# Patient Record
Sex: Female | Born: 1985
Health system: Southern US, Community
[De-identification: ages and names within clinical notes are randomized; demographics above are authoritative.]

## PROBLEM LIST (undated history)

## (undated) ENCOUNTER — Inpatient Hospital Stay (HOSPITAL_COMMUNITY): Payer: Self-pay

## (undated) DIAGNOSIS — J45909 Unspecified asthma, uncomplicated: Secondary | ICD-10-CM

## (undated) DIAGNOSIS — D649 Anemia, unspecified: Secondary | ICD-10-CM

## (undated) DIAGNOSIS — K429 Umbilical hernia without obstruction or gangrene: Secondary | ICD-10-CM

## (undated) DIAGNOSIS — O039 Complete or unspecified spontaneous abortion without complication: Secondary | ICD-10-CM

## (undated) DIAGNOSIS — I82409 Acute embolism and thrombosis of unspecified deep veins of unspecified lower extremity: Secondary | ICD-10-CM

## (undated) DIAGNOSIS — O021 Missed abortion: Secondary | ICD-10-CM

## (undated) DIAGNOSIS — D6862 Lupus anticoagulant syndrome: Secondary | ICD-10-CM

## (undated) HISTORY — DX: Complete or unspecified spontaneous abortion without complication: O03.9

## (undated) HISTORY — DX: Anemia, unspecified: D64.9

## (undated) HISTORY — PX: TONSILLECTOMY: SUR1361

## (undated) HISTORY — DX: Lupus anticoagulant syndrome: D68.62

## (undated) HISTORY — DX: Missed abortion: O02.1

## (undated) SURGERY — Surgical Case
Anesthesia: *Unknown

---
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
## 2004-07-06 ENCOUNTER — Emergency Department (HOSPITAL_COMMUNITY): Admission: EM | Admit: 2004-07-06 | Discharge: 2004-07-06 | Payer: Self-pay | Admitting: Emergency Medicine

## 2005-02-01 ENCOUNTER — Emergency Department (HOSPITAL_COMMUNITY): Admission: EM | Admit: 2005-02-01 | Discharge: 2005-02-02 | Payer: Self-pay | Admitting: Emergency Medicine

## 2005-05-01 DIAGNOSIS — D649 Anemia, unspecified: Secondary | ICD-10-CM

## 2005-05-01 HISTORY — DX: Anemia, unspecified: D64.9

## 2005-08-29 ENCOUNTER — Ambulatory Visit (HOSPITAL_COMMUNITY): Admission: RE | Admit: 2005-08-29 | Discharge: 2005-08-29 | Payer: Self-pay | Admitting: Obstetrics

## 2005-09-29 ENCOUNTER — Ambulatory Visit (HOSPITAL_COMMUNITY): Admission: RE | Admit: 2005-09-29 | Discharge: 2005-09-29 | Payer: Self-pay | Admitting: Obstetrics

## 2005-11-09 ENCOUNTER — Inpatient Hospital Stay (HOSPITAL_COMMUNITY): Admission: AD | Admit: 2005-11-09 | Discharge: 2005-11-10 | Payer: Self-pay | Admitting: Obstetrics

## 2005-11-19 ENCOUNTER — Inpatient Hospital Stay (HOSPITAL_COMMUNITY): Admission: AD | Admit: 2005-11-19 | Discharge: 2005-11-19 | Payer: Self-pay | Admitting: Obstetrics & Gynecology

## 2006-02-10 ENCOUNTER — Inpatient Hospital Stay (HOSPITAL_COMMUNITY): Admission: AD | Admit: 2006-02-10 | Discharge: 2006-02-10 | Payer: Self-pay | Admitting: Obstetrics

## 2006-02-26 ENCOUNTER — Observation Stay (HOSPITAL_COMMUNITY): Admission: AD | Admit: 2006-02-26 | Discharge: 2006-02-26 | Payer: Self-pay | Admitting: Obstetrics & Gynecology

## 2006-02-28 ENCOUNTER — Inpatient Hospital Stay (HOSPITAL_COMMUNITY): Admission: AD | Admit: 2006-02-28 | Discharge: 2006-03-04 | Payer: Self-pay | Admitting: Obstetrics & Gynecology

## 2006-03-01 DIAGNOSIS — D6862 Lupus anticoagulant syndrome: Secondary | ICD-10-CM

## 2006-03-01 HISTORY — DX: Lupus anticoagulant syndrome: D68.62

## 2006-03-21 ENCOUNTER — Encounter: Payer: Self-pay | Admitting: Emergency Medicine

## 2006-03-21 ENCOUNTER — Inpatient Hospital Stay (HOSPITAL_COMMUNITY): Admission: AD | Admit: 2006-03-21 | Discharge: 2006-03-30 | Payer: Self-pay | Admitting: Obstetrics

## 2006-03-24 ENCOUNTER — Encounter: Payer: Self-pay | Admitting: Vascular Surgery

## 2006-04-03 ENCOUNTER — Encounter (INDEPENDENT_AMBULATORY_CARE_PROVIDER_SITE_OTHER): Payer: Self-pay | Admitting: Infectious Diseases

## 2006-04-03 ENCOUNTER — Ambulatory Visit: Payer: Self-pay | Admitting: *Deleted

## 2006-04-03 LAB — CONVERTED CEMR LAB
ALT: 33 units/L (ref 0–35)
AST: 32 units/L (ref 0–37)
Alkaline Phosphatase: 67 units/L (ref 39–117)
Basophils Absolute: 0.2 10*3/uL — ABNORMAL HIGH (ref 0.0–0.1)
CO2: 31 meq/L (ref 19–32)
Eosinophils Relative: 2 % (ref 0–4)
Glucose, Bld: 97 mg/dL (ref 70–99)
HCT: 24.8 % — ABNORMAL LOW (ref 34.4–43.3)
Lymphocytes Relative: 17 % (ref 15–43)
Lymphs Abs: 2 10*3/uL (ref 0.8–3.1)
MCV: 82.6 fL (ref 78.8–100.0)
Monocytes Relative: 9 % (ref 3–11)
Neutrophils Relative %: 70 % (ref 47–77)
Prothrombin Time: 25.3 s — ABNORMAL HIGH (ref 11.6–15.2)
RBC: 3 M/uL — ABNORMAL LOW (ref 3.79–4.96)
Total Bilirubin: 0.3 mg/dL (ref 0.3–1.2)
Total Protein: 7.9 g/dL (ref 6.0–8.3)
WBC: 11.4 10*3/uL — ABNORMAL HIGH (ref 3.7–10.0)

## 2006-04-05 ENCOUNTER — Ambulatory Visit: Payer: Self-pay | Admitting: Internal Medicine

## 2006-04-05 ENCOUNTER — Encounter (INDEPENDENT_AMBULATORY_CARE_PROVIDER_SITE_OTHER): Payer: Self-pay | Admitting: Infectious Diseases

## 2006-04-05 LAB — CONVERTED CEMR LAB
Basophils Relative: 0 % (ref 0–1)
Iron: 30 ug/dL — ABNORMAL LOW (ref 42–145)
Lymphocytes Relative: 23 % (ref 15–43)
MCHC: 32.3 g/dL — ABNORMAL LOW (ref 33.1–35.4)
Monocytes Relative: 11 % (ref 3–11)
Neutro Abs: 6 10*3/uL (ref 1.8–6.8)
Neutrophils Relative %: 63 % (ref 47–77)
Platelets: 1637 10*3/uL (ref 152–374)
RBC Folate: 895 ng/mL — ABNORMAL HIGH (ref 180–600)
RDW: 17.8 % — ABNORMAL HIGH (ref 11.5–15.3)
Saturation Ratios: 10 % — ABNORMAL LOW (ref 20–55)
TIBC: 297 ug/dL (ref 250–470)

## 2006-04-09 ENCOUNTER — Ambulatory Visit: Payer: Self-pay | Admitting: Internal Medicine

## 2006-04-14 ENCOUNTER — Ambulatory Visit: Payer: Self-pay | Admitting: Hematology & Oncology

## 2006-04-16 ENCOUNTER — Ambulatory Visit: Payer: Self-pay | Admitting: *Deleted

## 2006-04-23 ENCOUNTER — Ambulatory Visit: Payer: Self-pay | Admitting: Internal Medicine

## 2006-04-26 ENCOUNTER — Ambulatory Visit: Payer: Self-pay | Admitting: Internal Medicine

## 2006-04-26 ENCOUNTER — Encounter (INDEPENDENT_AMBULATORY_CARE_PROVIDER_SITE_OTHER): Payer: Self-pay | Admitting: Infectious Diseases

## 2006-04-26 LAB — CONVERTED CEMR LAB
Basophils Absolute: 0 10*3/uL (ref 0.0–0.1)
Hemoglobin: 10.6 g/dL — ABNORMAL LOW (ref 12.0–15.0)
Lymphocytes Relative: 60 % — ABNORMAL HIGH (ref 12–46)
Lymphs Abs: 2.4 10*3/uL (ref 0.7–3.3)
MCHC: 31.3 g/dL (ref 30.0–36.0)
Monocytes Absolute: 0.5 10*3/uL (ref 0.2–0.7)
Neutro Abs: 0.9 10*3/uL — ABNORMAL LOW (ref 1.7–7.7)
Neutrophils Relative %: 24 % — ABNORMAL LOW (ref 43–77)
RBC: 3.95 M/uL (ref 3.87–5.11)

## 2006-05-07 ENCOUNTER — Ambulatory Visit: Payer: Self-pay | Admitting: Hospitalist

## 2006-05-28 ENCOUNTER — Ambulatory Visit: Payer: Self-pay | Admitting: Hospitalist

## 2006-06-11 ENCOUNTER — Ambulatory Visit: Payer: Self-pay | Admitting: Hospitalist

## 2006-07-02 ENCOUNTER — Ambulatory Visit: Payer: Self-pay | Admitting: Internal Medicine

## 2007-04-26 ENCOUNTER — Emergency Department (HOSPITAL_COMMUNITY): Admission: EM | Admit: 2007-04-26 | Discharge: 2007-04-26 | Payer: Self-pay | Admitting: Family Medicine

## 2007-05-02 DIAGNOSIS — O039 Complete or unspecified spontaneous abortion without complication: Secondary | ICD-10-CM

## 2007-05-02 HISTORY — DX: Complete or unspecified spontaneous abortion without complication: O03.9

## 2007-11-13 ENCOUNTER — Observation Stay (HOSPITAL_COMMUNITY): Admission: AD | Admit: 2007-11-13 | Discharge: 2007-11-13 | Payer: Self-pay | Admitting: Obstetrics and Gynecology

## 2007-11-13 ENCOUNTER — Encounter (INDEPENDENT_AMBULATORY_CARE_PROVIDER_SITE_OTHER): Payer: Self-pay | Admitting: Obstetrics and Gynecology

## 2007-11-13 ENCOUNTER — Ambulatory Visit: Payer: Self-pay | Admitting: Gynecology

## 2007-11-18 ENCOUNTER — Observation Stay (HOSPITAL_COMMUNITY): Admission: AD | Admit: 2007-11-18 | Discharge: 2007-11-19 | Payer: Self-pay | Admitting: Family Medicine

## 2008-04-30 ENCOUNTER — Ambulatory Visit (HOSPITAL_COMMUNITY): Admission: RE | Admit: 2008-04-30 | Discharge: 2008-04-30 | Payer: Self-pay | Admitting: Obstetrics & Gynecology

## 2008-05-01 DIAGNOSIS — O021 Missed abortion: Secondary | ICD-10-CM

## 2008-05-01 HISTORY — DX: Missed abortion: O02.1

## 2008-05-01 HISTORY — PX: DILATION AND CURETTAGE OF UTERUS: SHX78

## 2008-05-08 ENCOUNTER — Encounter: Payer: Self-pay | Admitting: Obstetrics & Gynecology

## 2008-05-08 ENCOUNTER — Ambulatory Visit (HOSPITAL_COMMUNITY): Admission: RE | Admit: 2008-05-08 | Discharge: 2008-05-08 | Payer: Self-pay | Admitting: Obstetrics & Gynecology

## 2009-02-16 ENCOUNTER — Other Ambulatory Visit: Payer: Self-pay | Admitting: Obstetrics & Gynecology

## 2009-02-16 ENCOUNTER — Encounter (INDEPENDENT_AMBULATORY_CARE_PROVIDER_SITE_OTHER): Payer: Self-pay | Admitting: Internal Medicine

## 2009-02-16 ENCOUNTER — Encounter (INDEPENDENT_AMBULATORY_CARE_PROVIDER_SITE_OTHER): Payer: Self-pay | Admitting: Emergency Medicine

## 2009-02-16 ENCOUNTER — Emergency Department (HOSPITAL_COMMUNITY): Admission: EM | Admit: 2009-02-16 | Discharge: 2009-02-16 | Payer: Self-pay | Admitting: Emergency Medicine

## 2009-02-16 ENCOUNTER — Ambulatory Visit: Payer: Self-pay | Admitting: Vascular Surgery

## 2009-02-18 ENCOUNTER — Encounter: Payer: Self-pay | Admitting: Pharmacist

## 2009-02-18 ENCOUNTER — Ambulatory Visit: Payer: Self-pay | Admitting: Internal Medicine

## 2009-02-18 DIAGNOSIS — D6859 Other primary thrombophilia: Secondary | ICD-10-CM | POA: Insufficient documentation

## 2009-02-18 DIAGNOSIS — O039 Complete or unspecified spontaneous abortion without complication: Secondary | ICD-10-CM | POA: Insufficient documentation

## 2009-02-18 DIAGNOSIS — O021 Missed abortion: Secondary | ICD-10-CM | POA: Insufficient documentation

## 2009-02-18 DIAGNOSIS — I82409 Acute embolism and thrombosis of unspecified deep veins of unspecified lower extremity: Secondary | ICD-10-CM | POA: Insufficient documentation

## 2009-02-18 DIAGNOSIS — D649 Anemia, unspecified: Secondary | ICD-10-CM | POA: Insufficient documentation

## 2009-02-18 HISTORY — DX: Complete or unspecified spontaneous abortion without complication: O03.9

## 2009-02-18 HISTORY — DX: Other primary thrombophilia: D68.59

## 2009-02-18 LAB — CONVERTED CEMR LAB
INR: 1.4
INR: 1.4

## 2009-02-22 ENCOUNTER — Ambulatory Visit: Payer: Self-pay | Admitting: Internal Medicine

## 2009-03-01 ENCOUNTER — Ambulatory Visit: Payer: Self-pay | Admitting: Internal Medicine

## 2009-03-01 LAB — CONVERTED CEMR LAB: INR: 1.4

## 2009-03-03 ENCOUNTER — Encounter: Payer: Self-pay | Admitting: Internal Medicine

## 2009-03-03 ENCOUNTER — Ambulatory Visit: Payer: Self-pay | Admitting: Internal Medicine

## 2009-03-03 LAB — CONVERTED CEMR LAB: INR: 1.8

## 2009-03-05 ENCOUNTER — Telehealth: Payer: Self-pay | Admitting: Internal Medicine

## 2009-03-08 ENCOUNTER — Ambulatory Visit: Payer: Self-pay | Admitting: Internal Medicine

## 2009-03-08 LAB — CONVERTED CEMR LAB: INR: 5.8

## 2009-03-15 ENCOUNTER — Ambulatory Visit: Payer: Self-pay | Admitting: Internal Medicine

## 2009-03-15 LAB — CONVERTED CEMR LAB: INR: 5.4

## 2009-03-22 ENCOUNTER — Encounter: Payer: Self-pay | Admitting: Internal Medicine

## 2009-03-22 ENCOUNTER — Telehealth: Payer: Self-pay | Admitting: Internal Medicine

## 2009-03-22 ENCOUNTER — Ambulatory Visit: Payer: Self-pay | Admitting: Internal Medicine

## 2009-03-22 LAB — CONVERTED CEMR LAB

## 2009-03-29 ENCOUNTER — Ambulatory Visit: Payer: Self-pay | Admitting: Internal Medicine

## 2009-03-29 LAB — CONVERTED CEMR LAB: INR: 1.5

## 2009-12-13 ENCOUNTER — Inpatient Hospital Stay (HOSPITAL_COMMUNITY): Admission: AD | Admit: 2009-12-13 | Discharge: 2009-12-13 | Payer: Self-pay | Admitting: Family Medicine

## 2009-12-13 ENCOUNTER — Encounter: Payer: Self-pay | Admitting: Family Medicine

## 2009-12-21 ENCOUNTER — Encounter: Payer: Self-pay | Admitting: Family Medicine

## 2009-12-21 ENCOUNTER — Ambulatory Visit (HOSPITAL_COMMUNITY): Admission: RE | Admit: 2009-12-21 | Discharge: 2009-12-21 | Payer: Self-pay | Admitting: Family Medicine

## 2009-12-23 ENCOUNTER — Inpatient Hospital Stay (HOSPITAL_COMMUNITY): Admission: AD | Admit: 2009-12-23 | Discharge: 2009-12-23 | Payer: Self-pay | Admitting: Obstetrics

## 2010-05-05 ENCOUNTER — Ambulatory Visit (HOSPITAL_COMMUNITY)
Admission: RE | Admit: 2010-05-05 | Discharge: 2010-05-05 | Payer: Self-pay | Source: Home / Self Care | Attending: Obstetrics | Admitting: Obstetrics

## 2010-05-13 ENCOUNTER — Ambulatory Visit (HOSPITAL_COMMUNITY)
Admission: RE | Admit: 2010-05-13 | Discharge: 2010-05-13 | Payer: Self-pay | Source: Home / Self Care | Attending: Obstetrics | Admitting: Obstetrics

## 2010-05-21 ENCOUNTER — Other Ambulatory Visit (HOSPITAL_COMMUNITY): Payer: Self-pay | Admitting: Obstetrics

## 2010-05-21 DIAGNOSIS — Z3682 Encounter for antenatal screening for nuchal translucency: Secondary | ICD-10-CM

## 2010-05-22 ENCOUNTER — Encounter: Payer: Self-pay | Admitting: Obstetrics

## 2010-05-23 ENCOUNTER — Encounter: Payer: Self-pay | Admitting: Emergency Medicine

## 2010-06-02 ENCOUNTER — Other Ambulatory Visit (HOSPITAL_COMMUNITY): Payer: Self-pay

## 2010-07-14 LAB — CBC
HCT: 36.7 % (ref 36.0–46.0)
Hemoglobin: 12.4 g/dL (ref 12.0–15.0)
MCHC: 33.7 g/dL (ref 30.0–36.0)
MCV: 92.8 fL (ref 78.0–100.0)

## 2010-07-14 LAB — URINALYSIS, ROUTINE W REFLEX MICROSCOPIC
Bilirubin Urine: NEGATIVE
Glucose, UA: NEGATIVE mg/dL
Hgb urine dipstick: NEGATIVE
Ketones, ur: 15 mg/dL — AB
Nitrite: NEGATIVE
Specific Gravity, Urine: >1.03 — ABNORMAL HIGH (ref 1.005–1.030)
pH: 6 (ref 5.0–8.0)

## 2010-07-14 LAB — RH IMMUNE GLOBULIN WORKUP (NOT WOMEN'S HOSP): ABO/RH(D): AB NEG

## 2010-07-14 LAB — URINE CULTURE: Culture  Setup Time: 201108152002

## 2010-07-14 LAB — URINE MICROSCOPIC-ADD ON

## 2010-07-14 LAB — GC/CHLAMYDIA PROBE AMP, GENITAL: GC Probe Amp, Genital: NEGATIVE

## 2010-07-14 LAB — WET PREP, GENITAL: Clue Cells Wet Prep HPF POC: NONE SEEN

## 2010-07-15 ENCOUNTER — Other Ambulatory Visit (HOSPITAL_COMMUNITY): Payer: Self-pay | Admitting: Obstetrics

## 2010-07-15 DIAGNOSIS — O2690 Pregnancy related conditions, unspecified, unspecified trimester: Secondary | ICD-10-CM

## 2010-07-15 DIAGNOSIS — Z0489 Encounter for examination and observation for other specified reasons: Secondary | ICD-10-CM

## 2010-07-21 ENCOUNTER — Other Ambulatory Visit (HOSPITAL_COMMUNITY): Payer: Self-pay

## 2010-07-22 ENCOUNTER — Other Ambulatory Visit (HOSPITAL_COMMUNITY): Payer: Self-pay | Admitting: Obstetrics

## 2010-07-22 ENCOUNTER — Ambulatory Visit (HOSPITAL_COMMUNITY)
Admission: RE | Admit: 2010-07-22 | Discharge: 2010-07-22 | Disposition: A | Discharge status: Home / Self Care | Payer: Medicaid Other | Source: Ambulatory Visit | Attending: Obstetrics | Admitting: Obstetrics

## 2010-07-22 DIAGNOSIS — O2690 Pregnancy related conditions, unspecified, unspecified trimester: Secondary | ICD-10-CM

## 2010-07-22 DIAGNOSIS — Z1389 Encounter for screening for other disorder: Secondary | ICD-10-CM | POA: Insufficient documentation

## 2010-07-22 DIAGNOSIS — Z0489 Encounter for examination and observation for other specified reasons: Secondary | ICD-10-CM

## 2010-07-22 DIAGNOSIS — O2230 Deep phlebothrombosis in pregnancy, unspecified trimester: Secondary | ICD-10-CM | POA: Insufficient documentation

## 2010-07-22 DIAGNOSIS — I82409 Acute embolism and thrombosis of unspecified deep veins of unspecified lower extremity: Secondary | ICD-10-CM | POA: Insufficient documentation

## 2010-07-22 DIAGNOSIS — O358XX0 Maternal care for other (suspected) fetal abnormality and damage, not applicable or unspecified: Secondary | ICD-10-CM | POA: Insufficient documentation

## 2010-07-22 DIAGNOSIS — O2620 Pregnancy care for patient with recurrent pregnancy loss, unspecified trimester: Secondary | ICD-10-CM | POA: Insufficient documentation

## 2010-07-22 DIAGNOSIS — Z3682 Encounter for antenatal screening for nuchal translucency: Secondary | ICD-10-CM

## 2010-07-22 DIAGNOSIS — Z363 Encounter for antenatal screening for malformations: Secondary | ICD-10-CM | POA: Insufficient documentation

## 2010-07-22 DIAGNOSIS — O34219 Maternal care for unspecified type scar from previous cesarean delivery: Secondary | ICD-10-CM | POA: Insufficient documentation

## 2010-07-26 ENCOUNTER — Other Ambulatory Visit (HOSPITAL_COMMUNITY): Payer: Self-pay

## 2010-08-04 LAB — POCT I-STAT, CHEM 8
BUN: 7 mg/dL (ref 6–23)
Calcium, Ion: 1.23 mmol/L (ref 1.12–1.32)
Chloride: 106 mEq/L (ref 96–112)
Creatinine, Ser: 0.8 mg/dL (ref 0.4–1.2)
Glucose, Bld: 85 mg/dL (ref 70–99)

## 2010-08-04 LAB — URINALYSIS, ROUTINE W REFLEX MICROSCOPIC
Nitrite: NEGATIVE
Specific Gravity, Urine: >1.03 — ABNORMAL HIGH (ref 1.005–1.030)
Urobilinogen, UA: 0.2 mg/dL (ref 0.0–1.0)
pH: 5.5 (ref 5.0–8.0)

## 2010-08-04 LAB — CBC
HCT: 35.4 % — ABNORMAL LOW (ref 36.0–46.0)
MCV: 93.5 fL (ref 78.0–100.0)
Platelets: 316 10*3/uL (ref 150–400)
RDW: 13.5 % (ref 11.5–15.5)

## 2010-08-04 LAB — URINE MICROSCOPIC-ADD ON

## 2010-08-15 LAB — CBC
HCT: 38 % (ref 36.0–46.0)
MCHC: 33.4 g/dL (ref 30.0–36.0)
MCV: 93.1 fL (ref 78.0–100.0)
Platelets: 387 10*3/uL (ref 150–400)
RDW: 13.5 % (ref 11.5–15.5)
WBC: 6.3 10*3/uL (ref 4.0–10.5)

## 2010-08-15 LAB — HEPATIC FUNCTION PANEL
ALT: 20 U/L (ref 0–35)
Alkaline Phosphatase: 31 U/L — ABNORMAL LOW (ref 39–117)
Indirect Bilirubin: 0.5 mg/dL (ref 0.3–0.9)
Total Protein: 7.8 g/dL (ref 6.0–8.3)

## 2010-08-15 LAB — RH IMMUNE GLOBULIN WORKUP (NOT WOMEN'S HOSP): Antibody Screen: NEGATIVE

## 2010-08-15 LAB — APTT: aPTT: 29 seconds (ref 24–37)

## 2010-08-23 ENCOUNTER — Encounter: Payer: Self-pay | Admitting: Ophthalmology

## 2010-09-13 NOTE — Op Note (Signed)
NAMEKRISTIEN, Tiffany [name]            ACCOUNT NO.:  [id number]   MEDICAL RECORD NO.:  [id number]          PATIENT TYPE:  AMB   LOCATION:  SDC                           [facility]:  [facility]   PHYSICIAN:  Roseanna Rainbow, M.D.DATE OF BIRTH:  [birth year]   DATE OF PROCEDURE:  DATE OF DISCHARGE:                               OPERATIVE REPORT   PREOPERATIVE DIAGNOSIS:  Missed abortion.   POSTOPERATIVE DIAGNOSIS:  Missed abortion.   PROCEDURE:  Suction dilatation and curettage.   SURGEON:  Roseanna Rainbow, MD   ANESTHESIA:  Monitored anesthesia care, paracervical block.   PATHOLOGY:  Products of conception.   ESTIMATED BLOOD LOSS:  Minimal.   COMPLICATIONS:  None.   PROCEDURE:  The patient was taken to the operating room with an IV  running.  She was placed in the dorsal lithotomy position and prepped  and draped in the usual sterile fashion.  After a time-out had been  completed, a bivalved speculum was placed in the patient's vagina.  The  anterior lip of the cervix was then infiltrated with 2 mL of 2%  Xylocaine.  The single-tooth tenaculum was then applied to this  location.  A 4 mL of 2% Xylocaine was then infiltrated at 4 and 7  o'clock to produce a paracervical block.  The cervix was then dilated  with [facility] dilators.  An 8-mm suction curette was then advanced into the  cervix up to the uterine fundus.  The curette was rotated, and the  device activated to evacuate the uterus of the products of conception.  A sharp curettage was then performed.  A final pass was then made with  the suction curette.  The single-tooth tenaculum was then removed with  minimal bleeding noted from the cervix.  At the close of the procedure,  the instrument and pack counts were said to be correct x2.  The patient  was taken to the PACU awake and in stable condition.      Roseanna Rainbow, M.D.  Electronically Signed     LAJ/MEDQ  D:  05/08/2008  T:  05/09/2008  Job:  [id number]

## 2010-09-13 NOTE — Discharge Summary (Signed)
[name], [name]            ACCOUNT NO.:  [id number]   MEDICAL RECORD NO.:  [id number]          PATIENT TYPE:  OBV   LOCATION:  9317                          [facility]ILITY[facility]  [facility]   PHYSICIAN:  Ginger Carne, MD  DATE OF BIRTH:  18-Mar-1986   DATE OF ADMISSION:  11/13/2007  DATE OF DISCHARGE:  11/13/2007                               DISCHARGE SUMMARY   REASON FOR HOSPITALIZATION:  First trimester spontaneous abortion, in-  hospital procedures, and obstetrical ultrasound.   FINAL DIAGNOSES:  1. First trimester spontaneous abortion.  2. Anemia.  3. Past history of left leg thrombophlebitis.   HOSPITAL COURSE:  This patient is a 25 year old gravida 3, para 2-0-1-2  who presented to Dr. Henderson Cloud service on the early morning of November 13, 2007, with heavy vaginal bleeding, quantitative hCG of 48,100, and  ultrasound findings consistent with a spontaneous abortion.  The patient  had a history of a left leg thrombophlebitis following the birth of her  second child in November 2007.  At that time, she had been placed on a 6-  month course of Coumadin with the first 5 days of treatment with  Lovenox.  Following her discharge, the patient states she was on said  medication for approximately 6 months after the birth of her second  child in November 2007.   The patient had been under the care of Dr. Daphine Deutscher in [place] to  manage her Coumadin.  She was on said Coumadin for 6 months and at that  point had stopped.   In February 2009 according to the patient, the patient was seen at  [facility]; at which time, she states that studies were  performed indicating a residual clot in the left leg.  At this  dictation, no available notes from [facility] to confirm her history.  At that point, Dr. Daphine Deutscher had started the patient on Coumadin, and on  October 06, 2007, was stopped  because of the tooth extraction.  The patient  had not restarted said Coumadin since then.   The patient,  therefore, was asked to restart Lovenox 40 mg daily for 5  days and a prescription provided and 5 mg of Coumadin each night, and  the patient will contact Dr. Daphine Deutscher today for continued followup.  She  understands the importance of this as to assure safety entrance of  preventative measures for thrombophlebitis and potential  thromboembolism.  The patient states she will have followup postabortal  care with Dr. Gaynell Face.  She is AB negative and RhoGAM was administered.  In addition, the patient's hemoglobin is 6.8.  She states that she has a  history of anemia, but was prescribed ferrous sulfate 325 mg twice a day  between meals.  The patient's all questions answered to the satisfaction  of said patient, and the patient verbalized understanding of same.      Ginger Carne, MD  Electronically Signed     SHB/MEDQ  D:  11/13/2007  T:  11/13/2007  Job:  [id number]

## 2010-09-14 ENCOUNTER — Ambulatory Visit (HOSPITAL_COMMUNITY)
Admission: RE | Admit: 2010-09-14 | Discharge: 2010-09-14 | Disposition: A | Discharge status: Home / Self Care | Payer: Medicaid Other | Source: Ambulatory Visit | Attending: Obstetrics | Admitting: Obstetrics

## 2010-09-14 DIAGNOSIS — Z3682 Encounter for antenatal screening for nuchal translucency: Secondary | ICD-10-CM

## 2010-09-14 DIAGNOSIS — O2620 Pregnancy care for patient with recurrent pregnancy loss, unspecified trimester: Secondary | ICD-10-CM | POA: Insufficient documentation

## 2010-09-14 DIAGNOSIS — I82409 Acute embolism and thrombosis of unspecified deep veins of unspecified lower extremity: Secondary | ICD-10-CM | POA: Insufficient documentation

## 2010-09-14 DIAGNOSIS — O34219 Maternal care for unspecified type scar from previous cesarean delivery: Secondary | ICD-10-CM | POA: Insufficient documentation

## 2010-09-16 NOTE — Op Note (Signed)
Tiffany [name], Tiffany [name]            ACCOUNT NO.:  [id number]   MEDICAL RECORD NO.:  [id number]          PATIENT TYPE:  INP   LOCATION:  9101                          [facility]   PHYSICIAN:  Roseanna Rainbow, M.D.DATE OF BIRTH:  Oct 06, 1985   DATE OF PROCEDURE:  02/28/2006  DATE OF DISCHARGE:                                 OPERATIVE REPORT   PREOPERATIVE DIAGNOSIS:  Intrauterine pregnancy at term, history of a  previous cesarean delivery, arrest of dilatation, active phase.   POSTOPERATIVE DIAGNOSIS:  Intrauterine pregnancy at term, history of a  previous cesarean delivery, arrest of dilatation, active phase, persistent  occiput posterior.   PROCEDURE:  Repeat cesarean delivery via Pfannenstiel skin incision.   SURGEON:  Jackson-Moore.   ANESTHESIA:  Epidural.   ESTIMATED BLOOD LOSS:  500 mL.   COMPLICATIONS:  None.   PROCEDURES:  The patient was taken to the operating room with an IV running  and an epidural catheter in situ.  She was placed in the dorsal supine  position with the left lateral tilt and prepped and draped in the usual  sterile fashion.  A Pfannenstiel skin incision was then made with the  scalpel and then carried down to the underlying fascia.  The fascia was  nicked in the midline.  The fascial incision was then extended bilaterally.  The superior aspect of the fascial incision was tented up and the underlying  rectus muscles dissected off.  The inferior aspect of the fascial incision  was manipulated in a similar fashion.  The rectus muscles were separated in  the midline.  The parietal peritoneum was tented up and entered sharply.  This incision was extended superiorly and inferiorly with good visualization  of the bladder.  The bladder blade was replaced.  The vesicouterine  peritoneum was tented up and entered sharply.  This incision was extended  bilaterally, the bladder flap created sharply.  The bladder blade was  replaced.  The lower uterine  segment was incised in a transverse fashion  with a scalpel.  The uterine incision was then extended bluntly.  The  infant's head was then delivered atraumatically.  The cord was clamped and  cut.  The infant was handed off to the waiting neonatologist.  She was  delivered a live born female.  Apgars were 9 at one and five minutes  respectively.  The weight was 8 pounds.  The placenta was then removed.  The  intrauterine cavity was evacuated of any remaining amniotic fluid, clots and  debris with moist laparotomy sponge.  The uterine incision was then  reapproximated in a running interlocking fashion using 0 Monocryl.  A second  suture of the same was used as an imbricating suture.  The paracolic gutters  were copiously irrigated.  The parietal peritoneum was reapproximated in a  running fashion with 2-0 Vicryl.  The fascia was reapproximated in a running  fashion with 0 PDS.  The skin  was reapproximated in a subcuticular fashion using 3-0 Monocryl.  At the  close of the procedure, the instrument and pack counts were said to be  correct x2.  A gram of cephazolin was given at cord clamp.  The patient was  taken to the PACU awake and in stable condition.      Roseanna Rainbow, M.D.  Electronically Signed     LAJ/MEDQ  D:  03/01/2006  T:  03/01/2006  Job:  [id number]

## 2010-09-16 NOTE — Op Note (Signed)
NAME:  Tiffany [name], Tiffany [name]                       ACCOUNT NO.:  [id number]   MEDICAL RECORD NO.:  [id number]                   PATIENT TYPE:  INP   LOCATION:  9308                                 [facility]   PHYSICIAN:  Clement Husbands, M.D.         DATE OF BIRTH:  Nov 07, 1985   DATE OF PROCEDURE:  11/06/2002  DATE OF DISCHARGE:                                 OPERATIVE REPORT   PREOPERATIVE DIAGNOSIS:  38 weeks pregnancy, frank breech, labor.   POSTOPERATIVE DIAGNOSIS:  38 weeks pregnancy, frank breech, labor.   OPERATION:  Low transverse cervical cesarean section.   SURGEON:  Burnadette Peter, M.D.   ASSISTANT:  Nursing staff.   ANESTHESIA:  Epidural.   PROCEDURE:  With the patient under satisfactory epidural anesthesia in the  supine position and slightly canted to the left, the urethra was prepped,  the Foley catheter was placed in the bladder, the abdomen was prepped and  draped.  A low abdominal transverse skin incision was made with the cutting  cautery and carried down through a thin subcutaneous tissue to the rectus  fascia which was then sharply transversely divided.  Before the procedure  started, the time out procedure was utilized.  After the fascia was  entered, the rectus muscles were separated in the midline.  The peritoneal  cavity was entered.  The peritoneum was transversely incised with the  bladder pushed inferiorly.  The bladder blade was positioned.  A lower  uterine transverse incision was made and then was extended bilaterally in  both directions with bandage scissors.  The amniotic fluid was clear.  Frank  breech presentation was noted.  The breech was delivered.  The legs were  deflexed and delivered.  The arms were flexed, pulled down, and the  delivered.  The head was very easily delivered.  The nasopharynx was  suctioned.  Spontaneous respirations and crying were noted.  The cord was  doubly clamped and divided.  The infant was passed off  to the neonatal team  that was in attendance.  Cord blood was obtained.  The placenta was removed.  The uterus was explored and was normal.  The uterus was lifted out of the  abdominal cavity. Intravenous  Pitocin drip was instituted.  Ancef was  given.   The uterine incision was closed with a running locking 0 Vicryl suture.  A  second layer of a similar suture imbricated the first layer.  Two bleeding  sites on the lower edge in the midline and on the left side required  individual 0 Vicryl suturing.  The suture line was irrigated.  Hemostasis  was good.  The vesicouterine peritoneum was reapproximated with a running 0  Vicryl sutures.  The fallopian tubes and ovaries were visualized and were  normal on both sides.  The uterus was repositioned in the abdominal cavity.  The peritoneum was closed with a running 0 Vicryl sutures.  The rectus  muscles were reapproximated in the midline with two interrupted 0 Vicryl  sutures.  The rectus fascia was closed with two running 0 Vicryl sutures  which were tied separately in the midline.  The subcutaneous tissue layer  was irrigated and hemostasis was good.  The skin edges were reapproximated  with  staples.  The patient tolerated the procedure well and was returned to the  recovery room in satisfactory condition.  Estimated blood loss was 600 mL.   The mother was delivered of a 6 pound 13 ounce female with an assigned Apgar  of 9 and 9.                                               Clement Husbands, M.D.    EFR/MEDQ  D:  11/06/2002  T:  11/06/2002  Job:  [id number]

## 2010-09-16 NOTE — Consult Note (Signed)
Tiffany [name], Tiffany [name]            ACCOUNT NO.:  [id number]   MEDICAL RECORD NO.:  [id number]          PATIENT TYPE:  INP   LOCATION:  9320                          [facility]   PHYSICIAN:  Hollice Espy, M.D.DATE OF BIRTH:  03-02-1986   DATE OF CONSULTATION:  03/29/2006  DATE OF DISCHARGE:                                 CONSULTATION   ATTENDING PHYSICIAN:  Dr. Tamela Oddi, OB-GYN.   REASON FOR REQUEST:  Fevers.   HISTORY OF PRESENT ILLNESS:  Patient is a 25 year old African-American  female with essentially no past medical history, other than she recently  delivered her second child by c-section on November 1st of this year.  Her previous child was also by c-section.  Patient initially had an  unremarkable pregnancy with no complications, and was discharged home on  November 4.  She was noted to have a fever at that time, and for the  past few days prior to admission, she noted some difficulty walking and  sitting and had complaining of some abdominal pain which appeared to be  worsening rather than improving.  She noted no drainage or discharge  from her c-section scar.  Patient underwent a CT of the abdomen and  pelvis, when she came into the emergency room, which showed evidence of  a considerable pre-sacral and peri-rectal edema, reflecting an  inflammatory phlegmon, but no drainable abscess was identified.  As also  noted, an indistinctiveness of the piriformis musculature with edema  tracking along the right sciatic notch and around the right sciatic  nerve, felt to be secondary inflammation as well.  Patient also noted to  have some mild bilateral hydoureters extending down to the iliac vessel  crossover, but no calculus was noted, and there was no complicating  features related to the low transverse abdominal incision.  Her  abdominal CT was unremarkable.  The CT of the abdomen was unremarkable.  Patient was admitted for endometritis and a questionable  phlegmon, put  on antibiotics.  Over the next several days, she was showing some signs  of improvement, but on November 24, she was complaining of some severe  leg pain on the left leg.  Her leg examined and noted to be  significantly larger than her right; however, she was found to have good  pulses.  Low extremity Dopplers were ordered to rule out a DVT.  She was  found to have an extensive occlusive DVT in the left leg involving the  femoral, common femoral and profundus veins, with no superficial venous  thrombosis noted.  The right lower extremity showed no evidence of any  DVT.  With these findings, patient was started on Lovenox and Coumadin.  She initially tolerated this well, and following admission her  temperatures remained stable with a T-Max noted on November 25 at 100.5.  OB-GYN consulted.  Discussed the case with Dr. Josephina Gip of [facility], who  recommended of course continuing the Lovenox and Coumadin, until  patient's INR was therapeutic and at that time continuing Coumadin for 6  months, and patient will be followed at the coagulation clinic.  In the  meantime, the patient was continued on antibiotics.  Specifically, she  was put on at time of admission, IV ampicillin 2 grams IV q.6, as well  as Gentamycin as per pharmacy protocol.  Clindamycin was added to the  patient's regimen at 900 IV q.8 on November 23.  Triple therapy was  continued on this patient, up until November 27.  At that time, on  November 27, these antibiotics were all discontinued, and she was  changed over to Levaquin 750 p.o. daily, which was substituted for  Avelox at hospital pharmacy.  From November 25, patient did well;  however, on November 26 patient was noted to have a overnight febrile  spike around 101, continued to be followed, then by November 27 in the  morning she had had a febrile spike of 102.  These febrile spikes  continued to persist and ranging anywhere from 101 to 102, despite the   fact that patient was clinically feeling better.  In fact, her INR was  found to be therapeutic by November 27, with an INR of 2.4.  At this  point, her Lovenox was discontinued, and she was on Coumadin alone.  However, the patient continued to have febrile spikes.  [facility]  Hospitalist were consulted for further evaluation of her fevers.  A two-  view of her chest, as well as urinalysis was ordered, both of which were  found to be completely normal.  Patient's last documented febrile spike  was at 6:00 a.m. on November 29 of 101.4.  Apparently, patient herself,  after evaluation is doing quite well.  She denies any headaches or  vision changes, dysphagia, chest pains, palpitations, shortness of  breath, wheezing, coughing, abdominal pain, hematuria, dysuria,  diarrhea, although she has problems with moving her bowels, because of  the positioning.  She had noted occasional left leg pain with some  significant movement but otherwise is doing quite well.   REVIEW OF SYSTEMS:  Otherwise negative.   PAST MEDICAL HISTORY:  Other than the acute processes described above  are negative, other than c-section x2.   MEDICATIONS AT HOME:  None.   ALLERGIES:  NONE.   MEDICATIONS:  Here in the hospital:  1. Iron 325 p.o. b.i.d.  2. Prenatal vitamins continued daily.  3. Coumadin and Lovenox.  Lovenox now has been discontinued.  4. P.r.n. Percocet, Phenergan and Ambien.  5. She has been on D5 Lactated Ringers since November 26.   SOCIAL HISTORY:  She denies any tobacco, alcohol or drug use.  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   FAMILY HISTORY:  Negative for DVT.   PHYSICAL EXAMINATION:  VITAL SIGNS:  Temp 98, heart rate 107.  She has  been tachycardic for most of her hospitalization.  Blood pressure  117/77, respirations 18, O2 sat normal on room air.  GENERAL:  Patient is alert and oriented x3, in no apparent distress.  HEENT:  Normocephalic, atraumatic, and mucous membranes are  moist. HEART:  She has no carotid bruits.  Heart is a regular rate and rhythm,  S1, S2.  She has a soft 2/6 what sounds to be a systolic ejection  murmur.  This appears to be more of an innocent flow murmur with either  congenital or with pregnancy, does not sound pathologic at this time.  LUNGS:  Clear to auscultation bilaterally.  ABDOMEN:  Soft.  She was previously gravid and distended, consistent  with a recent pregnancy, but otherwise c-section scars are fully  healed  with no signs of inflammation, edema or even tenderness.  Positive bowel  sounds.  EXTREMITIES:  She is in compression hose, but negative for Homan's sign  bilaterally.  There is some mild trace pitting edema on the left side  and decreased function secondary to decrease range of motion secondary  to pain but otherwise unremarkable, good peripheral pulses.   LABORATORY WORK:  As mentioned, chest x-ray and UA are normal.  CT is as  per HPI.  Urinalysis is essentially unremarkable.  Patient's white count  is 13.6 with a 76% neutrophil which is within the normal range.  Her H&H  is 8.1, 25.4, with a normal MCV, platelet count 756.  Her eosinophils  were within the normal range.  __________  less than 24 is unremarkable.  In review of previous CBCs, her white count has been as high as 19.5  with an 80% shift on November 26, since that time has been normal.   ASSESSMENT:  1. Endometritis with inflammatory phlegmon.  2. Extensive deep venous thrombosis.  3. Status post recent c-section.  4. Recurrent fevers.  5. Tachycardia.  6. Anemia.   PLAN:  Likely that the fevers appear to be the result of a resolving  endometritis, and her large DVT which can certainly cause recurrent  fevers.  I do not see any other signs of infection or signs that show  sort of  worsening process, given her regards to her triple antibiotic  therapy which was then changed over to PO4, quinolone and then  doxycycline.  I agree with this  antibiotic coverage.  Patient shows  continued signs of improving white count, despite recurrent fevers.  Would favor repeating a CBC with a differential in the morning, and as  long as it shows improvement, I would also check a repeat CT scan of the  abdomen and pelvis to ensure that there is no further worsenings, and as  long as both show signs of improvement, I feel the patient is medically  stable to be discharged.  In regards to her DVT, as long as she stays  therapeutic on her INR, she should do fine.  I discussed this with the  patient about the need for safety while on Coumadin.  When she tells me  she will need to follow, she appears to be quite educated on the  Coumadin, as well as DVT process, and I do not feel compliance would be  a concern in this patient.  Likely, her tachycardia is likely the result  of pain, plus her mild anemia which is likely from dehydration, recent  infection, plus her surgery several weeks ago.  Plan will be again to recheck her CBC with differential, as well as her CT.  There was a  question of weightbearing status which, unless the OB-GYN feels  otherwise, I feel that she can do as tolerated with no reason to  restrict her activity because of the DVT in itself, other than safety  caution issues of course, while on a blood thinning medication.      Hollice Espy, M.D.  Electronically Signed     SKK/MEDQ  D:  03/29/2006  T:  03/29/2006  Job:  [id number]   cc:   Roseanna Rainbow, M.D.  Fax: [phone]

## 2010-09-16 NOTE — Discharge Summary (Signed)
Tiffany [name], Tiffany [name]            ACCOUNT NO.:  [id number]   MEDICAL RECORD NO.:  [id number]          PATIENT TYPE:  INP   LOCATION:  9101                          [facility]   PHYSICIAN:  Charles A. Clearance Coots, M.D.DATE OF BIRTH:  1985-06-11   DATE OF ADMISSION:  02/28/2006  DATE OF DISCHARGE:  03/04/2006                                 DISCHARGE SUMMARY   ADMITTING DIAGNOSES:  1. Intrauterine pregnancy at term.  2. Previous cesarean section.  3. Desires trial of early labor   DISCHARGE DIAGNOSIS:  1. Intrauterine pregnancy at term.  2. Previous cesarean section.  3. Desires trial of early labor.  4. Status post repeat low transverse cesarean section on March 01, 2006,      for arrest of active phase of labor, arrest of dilatation, persistent      occiput posterior position.  A viable female infant was delivered at      0227, Apgars at 9 at 1 minute and 9 at 5 minutes, weight of 3645 grams,      length of 53.25 cm.  Mother and infant discharged home in good      condition.   REASON FOR ADMISSION:  A 25 year old black female, estimated date of  confinement of February 28, 2006, presented at [redacted] weeks gestation with  uterine contractions and spontaneous rupture of membranes.  Prenatal care  was uncomplicated.   PAST MEDICAL HISTORY:   SURGERY:  Cesarean section.   ILLNESSES:  None.   MEDICATIONS:  Prenatal vitamins.   ALLERGIES:  No known drug allergies.   SOCIAL HISTORY:  Single.  Negative tobacco, alcohol or recreational drug  use.   PHYSICAL EXAMINATION:  GENERAL:  Well-nourished, well-developed female in no  acute distress.  VITAL SIGNS:  Afebrile. Vital signs were stable.  LUNGS: Clear to auscultation bilaterally.  HEART: Regular rate and rhythm.  ABDOMEN: Gravid, nontender.  PELVIC:  Cervix 3 cm dilated, 90% effaced and vertex at -2 station.   ADMITTING LABORATORY VALUES:  Hemoglobin 10.8, hematocrit 31.7, white blood  cell count 12,900, platelets  357,000.   HOSPITAL COURSE:  The patient was admitted and progressed to 6-7 cm  dilatation and had no further progress for greater than 3 hours thereafter.  She was, therefore, taken to the operating room for repeat cesarean section  for arrest of active phase of labor. Repeat low transverse cesarean section  was performed without complications.  There were no postoperative  complications.  The patient was discharged home postop day #3 in good  condition. The patient did have a postop anemia that did not require  transfusion of blood products because she was clinically stable without any  orthostatic changes or headache or tiredness.  The baseline hemoglobin was  also borderline anemic   DISCHARGE LABORATORY VALUES:  Hemoglobin 8.2, hematocrit 24.2, white blood  cell count 11,700, platelets 354,000.   DISCHARGE DISPOSITION:   MEDICATIONS:  Tylox and ibuprofen were prescribed for pain.  Continue  prenatal vitamins.  Replevin was prescribed for anemia.   Routine written instructions per booklet were given for discharge after  cesarean section.  The patient  is to call office for followup appointment in  2 weeks.      Charles A. Clearance Coots, M.D.  Electronically Signed     CAH/MEDQ  D:  03/04/2006  T:  03/04/2006  Job:  [id number]

## 2010-09-16 NOTE — Discharge Summary (Signed)
Tiffany [name], Tiffany [name]            ACCOUNT NO.:  [id number]   MEDICAL RECORD NO.:  [id number]          PATIENT TYPE:  INP   LOCATION:  9320                          FACILITY:  [facility]   PHYSICIAN:  Roseanna Rainbow, M.D.DATE OF BIRTH:  10-11-1985   DATE OF ADMISSION:  03/21/2006  DATE OF DISCHARGE:  03/30/2006                               DISCHARGE SUMMARY   CHIEF COMPLAINT:  The patient is a 25 year old gravida 2, para 2, status  post a recent repeat cesarean delivery on March 01, 2006, complaining  of fevers and difficulty ambulating.   HISTORY OF PRESENT ILLNESS:  Please see the above. The patient reported  these symptoms for several days prior to presentation. The pain is  worsened by sitting upright. She also reports lower abdominal pain. She  presented to the emergency department and a CT scan of the abdomen and  pelvis was suspicious for a pelvic phlegmon.   ALLERGIES:  No known drug allergies.   MEDICATIONS:  Please see the medication reconciliation form.   PAST SURGICAL HISTORY:  Please see above.   PHYSICAL EXAMINATION:  VITAL SIGNS: Temperature 100.7.  GENERAL APPEARANCE: Well-developed female in no distress.  ABDOMEN: The incision is clean, dry and intact. The uterine fundus was  nontender.  PELVIC EXAM: Deferred.   ASSESSMENT:  Endometritis.   PLAN:  Admission and parenteral antibiotics.   HOSPITAL COURSE:  The patient was admitted and started on parenteral  antibiotics. The spectrum was advanced from ampicillin to ampicillin,  gentamicin and clindamycin.  On hospital day number 3 she reported left lower extremity pain. She had  a negative Homan's and the left lower extremity was felt to be  significantly edematous to the level of the thigh. Venous Doppler's of  the lower extremity were ordered. This demonstrated an extensive  occlusive DVT in the left leg extending through the popliteal to the  femoral, femoral and profunda veins. The patient was stared  on Lovenox  and was transitioned to Coumadin per pharmacy dosing.   On October 26 patient was complaining of nausea and vomiting. Her white  blood cell count was 19,500, hemoglobin was 9.2. An abdominal x-ray was  normal. She began spiking fevers. Internal medicine was consulted. On  November 30th her hemoglobin was 8.1, white count 10,600 and she had  been afebrile for at least 24 hours.   DISCHARGE DIAGNOSES:  1. Pelvic phlegmon status post cesarean delivery.  2. Left deep venous thrombosis, left lower extremity condition stable.   DIET:  Regular.   ACTIVITY:  As tolerated.   She was counseled to continue using TED hose.   MEDICATIONS:  1. Coumadin.  2. Percocet.  3. Doxycycline.  4. Prenatal vitamins.  5. Ferrous sulfate.   DISPOSITION:  The patient was to follow up in the office on Thursday,  April 05, 2006 at 1:40. She was to follow up in the [facility]  outpatient clinic on April 03, 2006 at 11:15.      Roseanna Rainbow, M.D.  Electronically Signed     LAJ/MEDQ  D:  04/20/2006  T:  04/20/2006  Job:  [id number]  cc:   Hollice Espy, M.D.

## 2010-09-16 NOTE — Discharge Summary (Signed)
   NAME:  [name], Tiffany [name] NO.:  [id number]   MEDICAL RECORD NO.:  [id number]                   PATIENT TYPE:  INP   LOCATION:  9308                                 [facility]ACILITY:  [facility]   PHYSICIAN:  Burnadette Peter, M.D.             DATE OF BIRTH:  December 22, 1985   DATE OF ADMISSION:  11/06/2002  DATE OF DISCHARGE:  11/09/2002                                 DISCHARGE SUMMARY   This is a 25 year old gravida 1, para 1 now who presented at 39-2/7 weeks,  LMP February 27, 2002, due date December 05, 2002.  She presented with a breech  presentation and therefore, she had a cesarean for breech presentation.  A  low-transverse cervical C-section was done by Dr. Perlie Gold on November 06, 2002.  Please refer to operative record.   The patient had an unremarkable stay.  She was afebrile during the time.  She was discharged on her third postoperative day.   FINAL DIAGNOSIS:  Pregnancy delivered by cesarean section for a breech  presentation.   She had no complications or infections.   FAMILY HISTORY:  There was no pertinent family history, it was unremarkable.   PAST MEDICAL HISTORY:  Unremarkable.   PAST SURGICAL HISTORY:  No surgical history prior to her C-section.   She was sent home on Percocet 5/325 one p.o. q.4-6h. p.r.n. #20, a  prescription was written.  For contraception, Depo-Provera 150 mg IM was  given and she was to followup at the [facility] in three days postoperatively for  an incision check and staples removed and at [facility] in six  weeks for her six week postpartum check up.     Clement Husbands, M.D.               Burnadette Peter, M.D.    EFR/MEDQ  D:  12/09/2002  T:  12/09/2002  Job:  [id number]

## 2010-09-28 ENCOUNTER — Inpatient Hospital Stay (HOSPITAL_COMMUNITY)
Admission: AD | Admit: 2010-09-28 | Discharge: 2010-09-28 | Disposition: A | Discharge status: Home / Self Care | Payer: Medicaid Other | Source: Ambulatory Visit | Attending: Obstetrics | Admitting: Obstetrics

## 2010-09-28 DIAGNOSIS — Z2989 Encounter for other specified prophylactic measures: Secondary | ICD-10-CM | POA: Insufficient documentation

## 2010-09-28 DIAGNOSIS — Z298 Encounter for other specified prophylactic measures: Secondary | ICD-10-CM | POA: Insufficient documentation

## 2010-09-29 LAB — RH IMMUNE GLOBULIN WORKUP (NOT WOMEN'S HOSP): ABO/RH(D): AB NEG

## 2010-10-06 ENCOUNTER — Other Ambulatory Visit (HOSPITAL_COMMUNITY): Payer: Self-pay | Admitting: Obstetrics

## 2010-10-06 DIAGNOSIS — O2690 Pregnancy related conditions, unspecified, unspecified trimester: Secondary | ICD-10-CM

## 2010-11-03 ENCOUNTER — Other Ambulatory Visit (HOSPITAL_COMMUNITY): Payer: Medicaid Other

## 2010-11-03 ENCOUNTER — Ambulatory Visit (HOSPITAL_COMMUNITY)
Admission: RE | Admit: 2010-11-03 | Discharge: 2010-11-03 | Disposition: A | Discharge status: Home / Self Care | Payer: Medicaid Other | Source: Ambulatory Visit | Attending: Obstetrics | Admitting: Obstetrics

## 2010-11-03 ENCOUNTER — Encounter (HOSPITAL_COMMUNITY): Payer: Self-pay

## 2010-11-03 DIAGNOSIS — O2690 Pregnancy related conditions, unspecified, unspecified trimester: Secondary | ICD-10-CM

## 2010-11-03 DIAGNOSIS — O34219 Maternal care for unspecified type scar from previous cesarean delivery: Secondary | ICD-10-CM | POA: Insufficient documentation

## 2010-11-03 DIAGNOSIS — O2620 Pregnancy care for patient with recurrent pregnancy loss, unspecified trimester: Secondary | ICD-10-CM | POA: Insufficient documentation

## 2010-11-03 DIAGNOSIS — I82409 Acute embolism and thrombosis of unspecified deep veins of unspecified lower extremity: Secondary | ICD-10-CM | POA: Insufficient documentation

## 2010-11-09 ENCOUNTER — Ambulatory Visit (HOSPITAL_COMMUNITY)
Admission: RE | Admit: 2010-11-09 | Discharge: 2010-11-09 | Disposition: A | Discharge status: Home / Self Care | Payer: Medicaid Other | Source: Ambulatory Visit | Attending: Obstetrics | Admitting: Obstetrics

## 2010-11-09 DIAGNOSIS — O2620 Pregnancy care for patient with recurrent pregnancy loss, unspecified trimester: Secondary | ICD-10-CM | POA: Insufficient documentation

## 2010-11-09 DIAGNOSIS — I82409 Acute embolism and thrombosis of unspecified deep veins of unspecified lower extremity: Secondary | ICD-10-CM | POA: Insufficient documentation

## 2010-11-09 DIAGNOSIS — O34219 Maternal care for unspecified type scar from previous cesarean delivery: Secondary | ICD-10-CM | POA: Insufficient documentation

## 2010-11-09 DIAGNOSIS — O2230 Deep phlebothrombosis in pregnancy, unspecified trimester: Secondary | ICD-10-CM | POA: Insufficient documentation

## 2010-11-09 DIAGNOSIS — O2690 Pregnancy related conditions, unspecified, unspecified trimester: Secondary | ICD-10-CM

## 2010-11-10 ENCOUNTER — Other Ambulatory Visit (HOSPITAL_COMMUNITY): Payer: Medicaid Other

## 2010-11-16 ENCOUNTER — Other Ambulatory Visit: Payer: Self-pay | Admitting: Obstetrics

## 2010-11-16 ENCOUNTER — Ambulatory Visit (HOSPITAL_COMMUNITY)
Admission: RE | Admit: 2010-11-16 | Discharge: 2010-11-16 | Disposition: A | Discharge status: Home / Self Care | Payer: Medicaid Other | Source: Ambulatory Visit | Attending: Obstetrics | Admitting: Obstetrics

## 2010-11-16 ENCOUNTER — Encounter (HOSPITAL_COMMUNITY): Payer: Self-pay

## 2010-11-16 VITALS — BP 103/79 | HR 54 | Wt 172.0 lb

## 2010-11-16 DIAGNOSIS — O2230 Deep phlebothrombosis in pregnancy, unspecified trimester: Secondary | ICD-10-CM | POA: Insufficient documentation

## 2010-11-16 DIAGNOSIS — O2690 Pregnancy related conditions, unspecified, unspecified trimester: Secondary | ICD-10-CM

## 2010-11-16 DIAGNOSIS — O34219 Maternal care for unspecified type scar from previous cesarean delivery: Secondary | ICD-10-CM | POA: Insufficient documentation

## 2010-11-16 DIAGNOSIS — I82409 Acute embolism and thrombosis of unspecified deep veins of unspecified lower extremity: Secondary | ICD-10-CM | POA: Insufficient documentation

## 2010-11-16 DIAGNOSIS — O2620 Pregnancy care for patient with recurrent pregnancy loss, unspecified trimester: Secondary | ICD-10-CM | POA: Insufficient documentation

## 2010-11-17 ENCOUNTER — Other Ambulatory Visit (HOSPITAL_COMMUNITY): Payer: Medicaid Other

## 2010-11-20 ENCOUNTER — Encounter (HOSPITAL_COMMUNITY): Payer: Self-pay | Admitting: Anesthesiology

## 2010-11-20 ENCOUNTER — Encounter (HOSPITAL_COMMUNITY): Payer: Self-pay | Admitting: *Deleted

## 2010-11-20 ENCOUNTER — Other Ambulatory Visit: Payer: Self-pay | Admitting: Obstetrics

## 2010-11-20 ENCOUNTER — Inpatient Hospital Stay (HOSPITAL_COMMUNITY): Payer: Medicaid Other | Admitting: Anesthesiology

## 2010-11-20 ENCOUNTER — Encounter (HOSPITAL_COMMUNITY)
Admission: AD | Disposition: A | Discharge status: Home / Self Care | Payer: Self-pay | Source: Ambulatory Visit | Attending: Obstetrics

## 2010-11-20 ENCOUNTER — Inpatient Hospital Stay (HOSPITAL_COMMUNITY)
Admission: AD | Admit: 2010-11-20 | Discharge: 2010-11-23 | DRG: 766 | Disposition: A | Discharge status: Home / Self Care | Payer: Medicaid Other | Source: Ambulatory Visit | Attending: Obstetrics | Admitting: Obstetrics

## 2010-11-20 DIAGNOSIS — O34219 Maternal care for unspecified type scar from previous cesarean delivery: Principal | ICD-10-CM | POA: Diagnosis present

## 2010-11-20 DIAGNOSIS — Z98891 History of uterine scar from previous surgery: Secondary | ICD-10-CM

## 2010-11-20 DIAGNOSIS — IMO0002 Reserved for concepts with insufficient information to code with codable children: Secondary | ICD-10-CM | POA: Diagnosis present

## 2010-11-20 HISTORY — DX: Acute embolism and thrombosis of unspecified deep veins of unspecified lower extremity: I82.409

## 2010-11-20 HISTORY — DX: History of uterine scar from previous surgery: Z98.891

## 2010-11-20 LAB — COMPREHENSIVE METABOLIC PANEL
ALT: 20 U/L (ref 0–35)
Alkaline Phosphatase: 119 U/L — ABNORMAL HIGH (ref 39–117)
CO2: 23 mEq/L (ref 19–32)
Chloride: 100 mEq/L (ref 96–112)
GFR calc Af Amer: >60 mL/min (ref >60)
GFR calc non Af Amer: >60 mL/min (ref >60)
Glucose, Bld: 75 mg/dL (ref 70–99)
Potassium: 4.4 mEq/L (ref 3.5–5.1)
Sodium: 132 mEq/L — ABNORMAL LOW (ref 135–145)
Total Bilirubin: 0.2 mg/dL — ABNORMAL LOW (ref 0.3–1.2)
Total Protein: 7.6 g/dL (ref 6.0–8.3)

## 2010-11-20 LAB — DIFFERENTIAL
Eosinophils Absolute: 0.1 10*3/uL (ref 0.0–0.7)
Lymphocytes Relative: 24 % (ref 12–46)
Lymphs Abs: 3 10*3/uL (ref 0.7–4.0)
Monocytes Relative: 12 % (ref 3–12)
Neutro Abs: 7.7 10*3/uL (ref 1.7–7.7)
Neutrophils Relative %: 63 % (ref 43–77)

## 2010-11-20 LAB — CBC
Platelets: 346 10*3/uL (ref 150–400)
RBC: 3.72 MIL/uL — ABNORMAL LOW (ref 3.87–5.11)
RDW: 14.1 % (ref 11.5–15.5)
WBC: 12.3 10*3/uL — ABNORMAL HIGH (ref 4.0–10.5)

## 2010-11-20 LAB — POCT FERN TEST: Fern Test: POSITIVE

## 2010-11-20 LAB — HIV ANTIBODY (ROUTINE TESTING W REFLEX): HIV: NONREACTIVE

## 2010-11-20 LAB — ABO/RH: RH Type: NEGATIVE

## 2010-11-20 LAB — RPR: RPR Ser Ql: NONREACTIVE

## 2010-11-20 SURGERY — Surgical Case
Anesthesia: Spinal | Site: Abdomen | Wound class: Clean Contaminated

## 2010-11-20 MED ORDER — EPHEDRINE 5 MG/ML INJ
INTRAVENOUS | Status: AC
  Filled 2010-11-20: qty 10

## 2010-11-20 MED ORDER — ONDANSETRON HCL 4 MG/2ML IJ SOLN: 4.0000 mg | INTRAMUSCULAR | Status: DC | PRN

## 2010-11-20 MED ORDER — FENTANYL CITRATE 0.05 MG/ML IJ SOLN
INTRAMUSCULAR | Status: DC | PRN
  Administered 2010-11-20 (×3): 25 ug via INTRAVENOUS

## 2010-11-20 MED ORDER — BUPIVACAINE HCL 0.75 % IJ SOLN
INTRAMUSCULAR | Status: DC | PRN
  Administered 2010-11-20: 12 mL via PERINEURAL

## 2010-11-20 MED ORDER — SCOPOLAMINE 1 MG/3DAYS TD PT72
1.0000 | MEDICATED_PATCH | Freq: Once | TRANSDERMAL | Status: DC
  Administered 2010-11-20: 1.5 mg via TRANSDERMAL

## 2010-11-20 MED ORDER — ZOLPIDEM TARTRATE 5 MG PO TABS: 5.0000 mg | ORAL_TABLET | Freq: Every evening | ORAL | Status: DC | PRN

## 2010-11-20 MED ORDER — DIPHENHYDRAMINE HCL 25 MG PO CAPS: 25.0000 mg | ORAL_CAPSULE | Freq: Four times a day (QID) | ORAL | Status: DC | PRN

## 2010-11-20 MED ORDER — SCOPOLAMINE 1 MG/3DAYS TD PT72
MEDICATED_PATCH | TRANSDERMAL | Status: AC
  Administered 2010-11-20: 1.5 mg via TRANSDERMAL
  Filled 2010-11-20: qty 1

## 2010-11-20 MED ORDER — METOCLOPRAMIDE HCL 5 MG/ML IJ SOLN
10.0000 mg | Freq: Three times a day (TID) | INTRAMUSCULAR | Status: DC | PRN
  Administered 2010-11-20: 10 mg via INTRAVENOUS
  Filled 2010-11-20: qty 2

## 2010-11-20 MED ORDER — SODIUM CHLORIDE 0.9 % IV SOLN
1.0000 ug/kg/h | INTRAVENOUS | Status: DC | PRN
  Filled 2010-11-20: qty 2.5

## 2010-11-20 MED ORDER — SENNOSIDES-DOCUSATE SODIUM 8.6-50 MG PO TABS
1.0000 | ORAL_TABLET | Freq: Every day | ORAL | Status: DC
  Administered 2010-11-21 – 2010-11-22 (×2): 1 via ORAL

## 2010-11-20 MED ORDER — EPHEDRINE SULFATE 50 MG/ML IJ SOLN
INTRAMUSCULAR | Status: DC | PRN
  Administered 2010-11-20: 10 mg via INTRAVENOUS
  Administered 2010-11-20: 20 mg via INTRAVENOUS

## 2010-11-20 MED ORDER — KETOROLAC TROMETHAMINE 30 MG/ML IJ SOLN: 30.0000 mg | Freq: Four times a day (QID) | INTRAMUSCULAR | Status: AC | PRN

## 2010-11-20 MED ORDER — ONDANSETRON HCL 4 MG/2ML IJ SOLN
INTRAMUSCULAR | Status: AC
  Filled 2010-11-20: qty 2

## 2010-11-20 MED ORDER — NALBUPHINE HCL 10 MG/ML IJ SOLN
5.0000 mg | INTRAMUSCULAR | Status: AC | PRN
  Filled 2010-11-20: qty 1

## 2010-11-20 MED ORDER — ONDANSETRON HCL 4 MG/2ML IJ SOLN: 4.0000 mg | Freq: Three times a day (TID) | INTRAMUSCULAR | Status: DC | PRN

## 2010-11-20 MED ORDER — SIMETHICONE 80 MG PO CHEW
80.0000 mg | CHEWABLE_TABLET | Freq: Three times a day (TID) | ORAL | Status: DC
  Administered 2010-11-21 – 2010-11-23 (×9): 80 mg via ORAL

## 2010-11-20 MED ORDER — NALOXONE HCL 0.4 MG/ML IJ SOLN: 0.4000 mg | INTRAMUSCULAR | Status: DC | PRN

## 2010-11-20 MED ORDER — LACTATED RINGERS IV SOLN
Freq: Once | INTRAVENOUS | Status: AC
  Administered 2010-11-20 (×3): via INTRAVENOUS

## 2010-11-20 MED ORDER — DIPHENHYDRAMINE HCL 50 MG/ML IJ SOLN
12.5000 mg | INTRAMUSCULAR | Status: DC | PRN
  Administered 2010-11-20: 12.5 mg via INTRAVENOUS
  Filled 2010-11-20 (×2): qty 1

## 2010-11-20 MED ORDER — OXYTOCIN 20 UNITS IN LACTATED RINGERS INFUSION - SIMPLE
125.0000 mL/h | INTRAVENOUS | Status: DC
  Administered 2010-11-21: 125 mL/h via INTRAVENOUS
  Filled 2010-11-20: qty 1000

## 2010-11-20 MED ORDER — HYDROMORPHONE HCL 1 MG/ML IJ SOLN: 0.2500 mg | INTRAMUSCULAR | Status: DC | PRN

## 2010-11-20 MED ORDER — SODIUM CHLORIDE 0.9 % IR SOLN
Status: DC | PRN
  Administered 2010-11-20: 1000 mL

## 2010-11-20 MED ORDER — MORPHINE SULFATE 0.5 MG/ML IJ SOLN
INTRAMUSCULAR | Status: AC
  Filled 2010-11-20: qty 10

## 2010-11-20 MED ORDER — ONDANSETRON HCL 4 MG/2ML IJ SOLN
INTRAMUSCULAR | Status: DC | PRN
  Administered 2010-11-20: 4 mg via INTRAVENOUS

## 2010-11-20 MED ORDER — SIMETHICONE 80 MG PO CHEW: 80.0000 mg | CHEWABLE_TABLET | ORAL | Status: DC | PRN

## 2010-11-20 MED ORDER — OXYTOCIN 10 UNIT/ML IJ SOLN
INTRAMUSCULAR | Status: AC
  Filled 2010-11-20: qty 4

## 2010-11-20 MED ORDER — CEFAZOLIN SODIUM 1-5 GM-% IV SOLN
INTRAVENOUS | Status: AC
  Filled 2010-11-20: qty 100

## 2010-11-20 MED ORDER — FENTANYL CITRATE 0.05 MG/ML IJ SOLN
INTRAMUSCULAR | Status: AC
  Filled 2010-11-20: qty 2

## 2010-11-20 MED ORDER — DIPHENHYDRAMINE HCL 50 MG/ML IJ SOLN: 25.0000 mg | INTRAMUSCULAR | Status: DC | PRN

## 2010-11-20 MED ORDER — SODIUM CHLORIDE 0.9 % IJ SOLN: 3.0000 mL | INTRAMUSCULAR | Status: DC | PRN

## 2010-11-20 MED ORDER — MORPHINE SULFATE (PF) 0.5 MG/ML IJ SOLN
INTRAMUSCULAR | Status: DC | PRN
  Administered 2010-11-20: 100 ug via INTRATHECAL

## 2010-11-20 MED ORDER — CEFAZOLIN SODIUM 1-5 GM-% IV SOLN
INTRAVENOUS | Status: DC | PRN
  Administered 2010-11-20: 2 g via INTRAVENOUS

## 2010-11-20 MED ORDER — ACETAMINOPHEN 10 MG/ML IV SOLN
1000.0000 mg | Freq: Four times a day (QID) | INTRAVENOUS | Status: AC
  Filled 2010-11-20 (×4): qty 100

## 2010-11-20 MED ORDER — OXYCODONE-ACETAMINOPHEN 5-325 MG PO TABS: 1.0000 | ORAL_TABLET | ORAL | Status: DC | PRN

## 2010-11-20 MED ORDER — ONDANSETRON HCL 4 MG PO TABS: 4.0000 mg | ORAL_TABLET | ORAL | Status: DC | PRN

## 2010-11-20 MED ORDER — TETANUS-DIPHTH-ACELL PERTUSSIS 5-2.5-18.5 LF-MCG/0.5 IM SUSP: 0.5000 mL | Freq: Once | INTRAMUSCULAR | Status: DC

## 2010-11-20 MED ORDER — WITCH HAZEL-GLYCERIN EX PADS: MEDICATED_PAD | CUTANEOUS | Status: DC | PRN

## 2010-11-20 MED ORDER — OXYTOCIN 20 UNITS IN LACTATED RINGERS INFUSION - SIMPLE
INTRAVENOUS | Status: DC | PRN
  Administered 2010-11-20 (×2): 20 [IU] via INTRAVENOUS

## 2010-11-20 MED ORDER — IBUPROFEN 600 MG PO TABS: 600.0000 mg | ORAL_TABLET | Freq: Four times a day (QID) | ORAL | Status: DC

## 2010-11-20 MED ORDER — MEPERIDINE HCL 25 MG/ML IJ SOLN: 6.2500 mg | INTRAMUSCULAR | Status: DC | PRN

## 2010-11-20 MED ORDER — DIPHENHYDRAMINE HCL 25 MG PO CAPS: 25.0000 mg | ORAL_CAPSULE | ORAL | Status: DC | PRN

## 2010-11-20 MED ORDER — FENTANYL CITRATE 0.05 MG/ML IJ SOLN
INTRAMUSCULAR | Status: DC | PRN
  Administered 2010-11-20: 25 ug via INTRATHECAL

## 2010-11-20 MED ORDER — ACETAMINOPHEN 325 MG PO TABS: 325.0000 mg | ORAL_TABLET | ORAL | Status: DC | PRN

## 2010-11-20 MED ORDER — PRENATAL PLUS 27-1 MG PO TABS
1.0000 | ORAL_TABLET | Freq: Every day | ORAL | Status: DC
  Administered 2010-11-21 – 2010-11-23 (×2): 1 via ORAL
  Filled 2010-11-20 (×2): qty 1

## 2010-11-20 MED ORDER — PROMETHAZINE HCL 25 MG/ML IJ SOLN: 6.2500 mg | INTRAMUSCULAR | Status: DC | PRN

## 2010-11-20 MED ORDER — CITRIC ACID-SODIUM CITRATE 334-500 MG/5ML PO SOLN
ORAL | Status: AC
  Administered 2010-11-20: 30 mL via ORAL
  Filled 2010-11-20: qty 15

## 2010-11-20 MED ORDER — MENTHOL 3 MG MT LOZG: 1.0000 | LOZENGE | OROMUCOSAL | Status: DC | PRN

## 2010-11-20 MED ORDER — ACETAMINOPHEN 10 MG/ML IV SOLN
1000.0000 mg | Freq: Once | INTRAVENOUS | Status: DC | PRN
  Filled 2010-11-20: qty 100

## 2010-11-20 MED ORDER — IBUPROFEN 600 MG PO TABS
600.0000 mg | ORAL_TABLET | Freq: Four times a day (QID) | ORAL | Status: DC | PRN
  Administered 2010-11-21: 600 mg via ORAL
  Filled 2010-11-20 (×2): qty 1

## 2010-11-20 SURGICAL SUPPLY — 37 items
CANISTER WOUND CARE 500ML ATS (WOUND CARE) IMPLANT
CHLORAPREP W/TINT 26ML (MISCELLANEOUS) IMPLANT
CLOTH BEACON ORANGE TIMEOUT ST (SAFETY) ×2 IMPLANT
CONTAINER PREFILL 10% NBF 15ML (MISCELLANEOUS) ×4 IMPLANT
DERMABOND ADVANCED (GAUZE/BANDAGES/DRESSINGS) ×2 IMPLANT
DRAPE UTILITY XL STRL (DRAPES) IMPLANT
DRSG VAC ATS LRG SENSATRAC (GAUZE/BANDAGES/DRESSINGS) IMPLANT
DRSG VAC ATS MED SENSATRAC (GAUZE/BANDAGES/DRESSINGS) IMPLANT
DRSG VAC ATS SM SENSATRAC (GAUZE/BANDAGES/DRESSINGS) IMPLANT
ELECT REM PT RETURN 9FT ADLT (ELECTROSURGICAL) ×2
ELECTRODE REM PT RTRN 9FT ADLT (ELECTROSURGICAL) ×1 IMPLANT
EXTRACTOR VACUUM M CUP 4 TUBE (SUCTIONS) ×2 IMPLANT
GLOVE BIO SURGEON STRL SZ8 (GLOVE) ×6 IMPLANT
GOWN BRE IMP SLV AUR LG STRL (GOWN DISPOSABLE) ×4 IMPLANT
GOWN BRE IMP SLV AUR XL STRL (GOWN DISPOSABLE) ×2 IMPLANT
KIT ABG SYR 3ML LUER SLIP (SYRINGE) IMPLANT
NEEDLE HYPO 25X5/8 SAFETYGLIDE (NEEDLE) IMPLANT
NS IRRIG 1000ML POUR BTL (IV SOLUTION) ×2 IMPLANT
PACK C SECTION WH (CUSTOM PROCEDURE TRAY) ×2 IMPLANT
RTRCTR C-SECT PINK 25CM LRG (MISCELLANEOUS) IMPLANT
SLEEVE SCD COMPRESS KNEE MED (MISCELLANEOUS) ×2 IMPLANT
STAPLER VISISTAT 35W (STAPLE) IMPLANT
SUT GUT PLAIN 0 CT-3 TAN 27 (SUTURE) ×2 IMPLANT
SUT MNCRL 0 VIOLET CTX 36 (SUTURE) ×3 IMPLANT
SUT MNCRL AB 4-0 PS2 18 (SUTURE) ×2 IMPLANT
SUT MON AB 2-0 CT1 27 (SUTURE) ×2 IMPLANT
SUT MON AB 3-0 SH 27 (SUTURE)
SUT MON AB 3-0 SH27 (SUTURE) IMPLANT
SUT MONOCRYL 0 CTX 36 (SUTURE) ×3
SUT PLAIN 2 0 XLH (SUTURE) IMPLANT
SUT VIC AB 0 CTX 36 (SUTURE) ×2
SUT VIC AB 0 CTX36XBRD ANBCTRL (SUTURE) ×2 IMPLANT
SUT VIC AB 2-0 CT1 27 (SUTURE)
SUT VIC AB 2-0 CT1 TAPERPNT 27 (SUTURE) IMPLANT
TOWEL OR 17X24 6PK STRL BLUE (TOWEL DISPOSABLE) ×2 IMPLANT
TRAY FOLEY CATH 14FR (SET/KITS/TRAYS/PACK) ×2 IMPLANT
WATER STERILE IRR 1000ML POUR (IV SOLUTION) ×2 IMPLANT

## 2010-11-20 NOTE — Progress Notes (Signed)
Big gush of fluid after shower

## 2010-11-20 NOTE — Brief Op Note (Signed)
11/20/2010  8:28 PM  PATIENT:  Tiffany [name]  25 y.o. female  PRE-OPERATIVE DIAGNOSIS:  previous cesarean section /in labor  POST-OPERATIVE DIAGNOSIS:  previous cesarean section /in labor  PROCEDURE:  Procedure(s): CESAREAN SECTION  SURGEON:  Surgeon(s): Brock Bad, MD  PHYSICIAN ASSISTANT:   ASSISTANTS: none   ANESTHESIA:   spinal  ESTIMATED BLOOD LOSS: * No blood loss amount entered *   BLOOD ADMINISTERED:none  DRAINS: Urinary Catheter (Foley)   LOCAL MEDICATIONS USED:  NONE Specimen:  Placenta  DISPOSITION OF SPECIMEN:  PATHOLOGY  COUNTS:  Correct  TOURNIQUET:  None DICTATION #: 1730  PLAN OF CARE: Routine post op care.  PATIENT DISPOSITION:  PACU - hemodynamically stable.   Delay start of Pharmacological VTE agent (>24hrs) due to surgical blood loss or risk of bleeding:  yes              Cesarean Section Procedure Note   Tiffany [name]  11/20/2010  Indications: 36 weeks.  Previous C/S x 2.  SROM.  Early labor.   Pre-operative Diagnosis: previous cesarean section /in labor.   Post-operative Diagnosis: Same   Surgeon: Surgeon(s) and Role:    * Brock Bad, MD - Primary   Assistants: none  Anesthesia: spinal   Procedure Details:  The patient was seen in the Holding Room. The risks, benefits, complications, treatment options, and expected outcomes were discussed with the patient. The patient concurred with the proposed plan, giving informed consent. identified as Tiffany [name] and the procedure verified as C-Section Delivery. A Time Out was held and the above information confirmed.  After induction of anesthesia, the patient was draped and prepped in the usual sterile manner. A transverse was made and carried down through the subcutaneous tissue to the fascia. Fascial incision was made and extended transversely. The fascia was separated from the underlying rectus tissue superiorly and inferiorly. The peritoneum was  identified and entered. Peritoneal incision was extended longitudinally. The utero-vesical peritoneal reflection was incised transversely and the bladder flap was bluntly freed from the lower uterine segment. A low transverse uterine incision was made. Delivered from cephalic presentation was a 3135 gram Female with Apgar scores of 9 at one minute and 9 at five minutes. Cord ph was not sent the umbilical cord was clamped and cut cord blood was obtained for evaluation. The placenta was removed Intact and appeared normal. The uterine outline, tubes and ovaries appeared normal}. The uterine incision was closed with running locked sutures of 0 Monocryl.   Hemostasis was observed. Lavage was carried out until clear. The fascia was then reapproximated with running sutures of 0Vicryl.  The skin was closed with staples.  Instrument, sponge, and needle counts were correct prior the abdominal closure and were correct at the conclusion of the case.    Findings:   Estimated Blood Loss: 500 ml.  Total IV Fluids:   Urine Output: 450 CC OF clear urine  Specimens:  Specimens    None       Complications: no complications  Disposition: PACU - hemodynamically stable.   Maternal Condition: stable   Baby condition / location:  nursery-stable  Attending Attestation: I was present and scrubbed for the entire procedure.   Signed: Surgeon(s): Brock Bad, MD

## 2010-11-20 NOTE — Transfer of Care (Signed)
Immediate Anesthesia Transfer of Care Note  Patient: Tiffany [name]  Procedure(s) Performed:  CESAREAN SECTION - repeat cesarean section   Patient Location: PACU  Anesthesia Type: Spinal  Level of Consciousness: awake, alert  and oriented  Airway & Oxygen Therapy: Patient Spontanous Breathing  Post-op Assessment: Report given to PACU RN and Post -op Vital signs reviewed and stable  Post vital signs: Reviewed and stable  Complications: No apparent anesthesia complications

## 2010-11-20 NOTE — Op Note (Signed)
See brief op note for details. 

## 2010-11-20 NOTE — H&P (Signed)
Tiffany [name] is [name] 25 y.o. female presenting for SROM, uterine contractions. Maternal Medical History:  Reason for admission: Reason for admission: rupture of membranes and contractions.  Previous C/S x 2.  H/O DVT, on Lovenox, last dose on 11-18-10.  Desires repeat C/S.  Contractions: Onset was 1-2 hours ago.   Frequency: regular.   Duration is approximately 1 minute.   Perceived severity is strong.    Fetal activity: Perceived fetal activity is normal.   Last perceived fetal movement was within the past hour.    Prenatal complications: no prenatal complications No thrombophilia.     OB History    Grav Para Term Preterm Abortions TAB SAB Ect Mult Living   7 2 2  4  4   2      Past Medical History  Diagnosis Date  . 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 DVT (deep venous thrombosis)   . Missed abortion 1/10    SP D&C  . SAB (spontaneous abortion) 1/09  . Anemia 2007    HGB 11.04 Feb 2009.  Marland Kitchen Deep vein thrombosis (DVT)     left leg   Past Surgical History  Procedure Date  . Cesarean section [id number]  . Dilation and curettage of uterus 1/10    For missed AB  . Tonselectomy 2010   Family History: family history includes Hyperlipidemia in her mother and Hypertension in her father and mother. Social History:  reports that she has never smoked. She does not have any smokeless tobacco history on file. She reports that she does not drink alcohol or use illicit drugs.  Review of Systems  Constitutional: Negative.   HENT: Negative.   Eyes: Negative.   Respiratory: Negative.   Cardiovascular: Negative.   Gastrointestinal: Negative.   Genitourinary: Negative.   Musculoskeletal: Negative.   Skin: Negative.   Neurological: Negative.   Endo/Heme/Allergies: Negative.   Psychiatric/Behavioral: Negative.     Dilation: 1.5 Effacement (%): 80 Blood pressure 145/89, pulse 72, temperature 97.9 F (36.6 C), temperature source Oral, resp. rate 18, height 5\' 6"   (1.676 m), weight 78.654 kg (173 lb 6.4 oz), last menstrual period 03/10/2010. Maternal Exam:  Uterine Assessment: Contraction strength is moderate.  Contraction duration is 1 minute. Contraction frequency is regular.   Abdomen: Patient reports no abdominal tenderness. Surgical scars: low transverse.   Estimated fetal weight is 7 lbs..   Fetal presentation: vertex  Introitus: Normal vulva. Normal vagina.  Ferning test: positive.  Nitrazine test: positive. Amniotic fluid character: clear.  Pelvis: of concern for delivery.   Cervix: Cervix evaluated by digital exam.     Physical Exam  Constitutional: She is oriented to person, place, and time. She appears well-developed and well-nourished.  HENT:  Head: Normocephalic and atraumatic.  Eyes: Conjunctivae and EOM are normal. Pupils are equal, round, and reactive to light.  Neck: Normal range of motion. Neck supple.  Cardiovascular: Normal rate and regular rhythm.   Respiratory: Effort normal and breath sounds normal.  GI: Soft. Bowel sounds are normal.  Genitourinary: Vagina normal and uterus normal.  Musculoskeletal: Normal range of motion.  Neurological: She is alert and oriented to person, place, and time. She has normal reflexes.  Skin: Skin is warm and dry.  Psychiatric: She has [name] normal mood and affect. Her behavior is normal. Judgment and thought content normal.    Prenatal labs: ABO, Rh: AB NEG (05/30 1109) Antibody: NEG (05/30 1109) Rubella:   RPR:    HBsAg: Negative (07/22  1733)  HIV: Non-reactive (07/22 1733)  GBS: Pending (07/22 1733)   Assessment/Plan: 36 weeks.  Previous C/S x 2. SROM.  Early labor.  H/O DVT, on Lovenox.  Desires repeat C/S.   Tiffany [name] 11/20/2010, 7:52 PM

## 2010-11-20 NOTE — Consult Note (Signed)
[facility]  Delivery Note:  C-section       11/20/2010  7:23 PM  I was called to the operating room at the request of the patient's obstetrician (Dr. Clearance Coots) for repeat c/section.   PRENATAL HX:  No problems noted.  INTRAPARTUM HX:   No labor.  DELIVERY:   Uncomplicated c/section.  The baby was vigorous.  Apgars 9 and 9.  _______________________________________________________________________ Electronically Signed By: Angelita Ingles, MD Neonatologist

## 2010-11-20 NOTE — Anesthesia Procedure Notes (Addendum)
Spinal Block  Patient location during procedure: OR Start time: 11/20/2010 6:35 PM Staffing Anesthesiologist: Brayton Caves R Performed by: anesthesiologist  Preanesthetic Checklist Completed: patient identified, site marked, surgical consent, pre-op evaluation, timeout performed, IV checked, risks and benefits discussed and monitors and equipment checked Spinal Block Patient position: sitting Prep: DuraPrep Patient monitoring: heart rate, cardiac monitor, continuous pulse ox and blood pressure Approach: midline Location: L3-4 Injection technique: single-shot Needle Needle type: Sprotte  Needle gauge: 24 G Needle length: 9 cm Assessment Sensory level: T4 Additional Notes Patient identified.  Risk benefits discussed including failed block, incomplete pain control, headache, nerve damage, paralysis, blood pressure changes, nausea, vomiting, reactions to medication both toxic or allergic, and postpartum back pain.  Patient expressed understanding and wished to proceed.  All questions were answered.  Sterile technique used throughout procedure.  CSF was clear.  No parasthesia or other complications.  Please see nursing notes for vital signs.

## 2010-11-20 NOTE — Anesthesia Preprocedure Evaluation (Signed)
Anesthesia Evaluation  Name, MR# and DOB Patient awake  General Assessment Comment  Reviewed: Allergy & Precautions, H&P  and Patient's Chart, lab work & pertinent test results  Airway Mallampati: II TM Distance: >3 FB Neck ROM: full    Dental No notable dental hx    Pulmonaryneg pulmonary ROS    clear to auscultation  pulmonary exam normal   Cardiovascular regular Normal   Neuro/PsychNegative Neurological ROS Negative Psych ROS  GI/Hepatic/Renal negative GI ROS, negative Liver ROS, and negative Renal ROS (+)       Endo/Other  Negative Endocrine ROS (+)   Abdominal   Musculoskeletal  Hematology negative hematology ROS (+)   Peds  Reproductive/Obstetrics (+) Pregnancy   Anesthesia Other Findings Lupus anticoagulant s/p DVT            Anesthesia Physical Anesthesia Plan  ASA: III and Emergent  Anesthesia Plan: Spinal   Post-op Pain Management:    Induction:   Airway Management Planned:   Additional Equipment:   Intra-op Plan:   Post-operative Plan:   Informed Consent: I have reviewed the patients History and Physical, chart, labs and discussed the procedure including the risks, benefits and alternatives for the proposed anesthesia with the patient or authorized representative who has indicated his/her understanding and acceptance.     Plan Discussed with:   Anesthesia Plan Comments:         Anesthesia Quick Evaluation

## 2010-11-21 LAB — CBC
MCV: 85.8 fL (ref 78.0–100.0)
Platelets: 282 10*3/uL (ref 150–400)
RDW: 14.1 % (ref 11.5–15.5)
WBC: 17.7 10*3/uL — ABNORMAL HIGH (ref 4.0–10.5)

## 2010-11-21 MED ORDER — ENOXAPARIN SODIUM 40 MG/0.4ML ~~LOC~~ SOLN
40.0000 mg | SUBCUTANEOUS | Status: DC
  Administered 2010-11-21 – 2010-11-22 (×2): 40 mg via SUBCUTANEOUS
  Filled 2010-11-21 (×3): qty 0.4

## 2010-11-21 MED ORDER — RHO D IMMUNE GLOBULIN 1500 UNIT/2ML IJ SOLN
300.0000 ug | Freq: Once | INTRAMUSCULAR | Status: AC
  Administered 2010-11-21: 300 ug via INTRAVENOUS

## 2010-11-21 MED ORDER — OXYCODONE-ACETAMINOPHEN 5-325 MG/5ML PO SOLN
5.0000 mL | ORAL | Status: DC | PRN
  Administered 2010-11-21 – 2010-11-23 (×4): 5 mL via ORAL
  Filled 2010-11-21 (×3): qty 5
  Filled 2010-11-21: qty 10

## 2010-11-21 MED ORDER — IBUPROFEN 100 MG/5ML PO SUSP
600.0000 mg | Freq: Four times a day (QID) | ORAL | Status: DC
  Administered 2010-11-21 – 2010-11-23 (×9): 600 mg via ORAL
  Filled 2010-11-21 (×14): qty 30

## 2010-11-21 MED ORDER — LACTATED RINGERS IV SOLN
INTRAVENOUS | Status: AC
  Administered 2010-11-21: 09:00:00 via INTRAVENOUS

## 2010-11-21 NOTE — Progress Notes (Signed)
  Postop day #1 Vital signs normal Lochia moderate Incision clean  Legs negative We'll restart Lovenox 40 subcutaneous this p.m.

## 2010-11-21 NOTE — Progress Notes (Signed)
UR chart review completed.  

## 2010-11-21 NOTE — Anesthesia Postprocedure Evaluation (Addendum)
  Anesthesia Post-op Note  Patient: Tiffany [name]  Procedure(s) Performed:  CESAREAN SECTION - repeat cesarean section   Patient's cardiopulmonary status is stable Patient's level of consciousness: sedate but responsive verbally Pain and nausea are all reasonably controlled No anesthetic complications apparent at this time No follow up care necessary at this time

## 2010-11-22 NOTE — Progress Notes (Signed)
  Postoperative day 2 Vital signs normal Fundus firm Incision clean Lochia moderate Legs negative Patient restarted on Lovenox 40 subcutaneous yesterday p.m. no complaints

## 2010-11-23 ENCOUNTER — Ambulatory Visit (HOSPITAL_COMMUNITY): Admission: RE | Admit: 2010-11-23 | Payer: Medicaid Other | Source: Ambulatory Visit

## 2010-11-23 LAB — RH IG WORKUP (INCLUDES ABO/RH)
ABO/RH(D): AB NEG
Fetal Screen: NEGATIVE
Gestational Age(Wks): 36

## 2010-11-23 MED ORDER — ENOXAPARIN SODIUM 40 MG/0.4ML ~~LOC~~ SOLN: 40.0000 mg | SUBCUTANEOUS | Status: DC

## 2010-11-23 NOTE — Progress Notes (Signed)
  Postoperative day #3 Vital signs normal Fundus firm Incision clean and dry Staples to be removed Lochia normal Legs negative Home today on Percocet and Lovenox as ordered

## 2010-11-23 NOTE — Discharge Summary (Signed)
Obstetric Discharge Summary Reason for Admission: onset of labor Prenatal Procedures: none Intrapartum Procedures: cesarean: low cervical, transverse Postpartum Procedures: none Complications-Operative and Postpartum: none  Hemoglobin  Date Value Range Status  11/21/2010 8.5* 12.0-15.0 (g/dL) Final     HCT  Date Value Range Status  11/21/2010 26.5* 36.0-46.0 (%) Final    Discharge Diagnoses: Term Pregnancy-delivered  Discharge Information: Date: 11/23/2010 Activity: pelvic rest Diet: routine Medications: Percocet Condition: stable Instructions: refer to practice specific booklet Discharge to: home Follow-up Information    Follow up with MARSHALL,BERNARD A, MD. Call in 6 weeks.   Contact information:   [address] [place] [address] [phone]          Newborn Data: Live born  Information for the patient's newborn:  Analissa, Bayless [[id number]]  female ; APGAR , ; weight ;  Home with mother.  MARSHALL,BERNARD A 11/23/2010, 7:02 AM

## 2010-11-24 ENCOUNTER — Other Ambulatory Visit (HOSPITAL_COMMUNITY): Payer: Medicaid Other

## 2010-12-01 ENCOUNTER — Other Ambulatory Visit (HOSPITAL_COMMUNITY): Payer: Medicaid Other

## 2010-12-08 ENCOUNTER — Encounter (HOSPITAL_COMMUNITY)
Admission: AD | Disposition: A | Discharge status: Home / Self Care | Payer: Self-pay | Source: Ambulatory Visit | Attending: Obstetrics

## 2010-12-08 SURGERY — Surgical Case
Anesthesia: Choice

## 2010-12-14 ENCOUNTER — Encounter (HOSPITAL_COMMUNITY): Payer: Self-pay | Admitting: Obstetrics

## 2011-01-02 NOTE — Progress Notes (Signed)
See OB ultrasound report in [facility] 

## 2011-01-13 ENCOUNTER — Encounter (HOSPITAL_COMMUNITY): Payer: Self-pay | Admitting: *Deleted

## 2011-01-26 LAB — DIFFERENTIAL
Basophils Relative: 0
Lymphocytes Relative: 12
Monocytes Absolute: 0.7
Monocytes Relative: 5
Neutro Abs: 10.8 — ABNORMAL HIGH

## 2011-01-26 LAB — POCT I-STAT, CHEM 8
BUN: 5 — ABNORMAL LOW
Chloride: 107
Sodium: 136

## 2011-01-26 LAB — URINALYSIS, ROUTINE W REFLEX MICROSCOPIC
Leukocytes, UA: NEGATIVE
Protein, ur: NEGATIVE
Urobilinogen, UA: 0.2

## 2011-01-26 LAB — ABO/RH: ABO/RH(D): AB NEG

## 2011-01-26 LAB — CBC
Hemoglobin: 10 — ABNORMAL LOW
MCHC: 34.3
RBC: 3.23 — ABNORMAL LOW

## 2011-01-26 LAB — SAMPLE TO BLOOD BANK

## 2011-01-26 LAB — RPR: RPR Ser Ql: NONREACTIVE

## 2011-01-26 LAB — URINE MICROSCOPIC-ADD ON

## 2011-01-27 LAB — CBC
Hemoglobin: 6.8 — CL
MCHC: 34.3
MCV: 91.9
RDW: 13.9

## 2011-01-27 LAB — RH IMMUNE GLOBULIN WORKUP (NOT WOMEN'S HOSP)
ABO/RH(D): AB NEG
Antibody Screen: NEGATIVE

## 2011-01-27 LAB — CROSSMATCH
ABO/RH(D): AB NEG
Antibody Screen: POSITIVE

## 2011-01-27 LAB — HEMOGLOBIN AND HEMATOCRIT, BLOOD
HCT: 32.3 — ABNORMAL LOW
Hemoglobin: 10.7 — ABNORMAL LOW

## 2011-01-27 LAB — PROTIME-INR: INR: 1.5

## 2011-04-18 ENCOUNTER — Inpatient Hospital Stay (HOSPITAL_COMMUNITY)
Admission: AD | Admit: 2011-04-18 | Discharge: 2011-04-18 | Disposition: A | Discharge status: Home / Self Care | Payer: Medicaid Other | Source: Ambulatory Visit | Attending: Obstetrics | Admitting: Obstetrics

## 2011-04-18 DIAGNOSIS — Z3201 Encounter for pregnancy test, result positive: Secondary | ICD-10-CM | POA: Insufficient documentation

## 2011-04-18 NOTE — ED Provider Notes (Signed)
Tiffany [name] is a 25 y.o. female here for pregnancy verification for health department. Denies any problems just late for period.  Pregnancy test here today is positive. LMP 03/10/11. Patient is 5.[redacted] weeks gestation with due date of 12/13/11. Pregnancy verification letter given and instructions for what she needs to take with her to the health department.  Patient will return here for any problems.  [place], [place] 04/18/11 [phone]

## 2011-04-18 NOTE — ED Provider Notes (Signed)
Attestation of Attending Supervision of Advanced Practitioner: Evaluation and management procedures were performed by the PA/NP/CNM/OB Fellow under my supervision/collaboration. Chart reviewed, and agree with management and plan.  Jaynie Collins, M.D. 04/18/2011 2:17 PM

## 2011-04-18 NOTE — Progress Notes (Addendum)
Pt states late on cycle, LMP- 03/10/2011, not taken upt at home yet. Denies bleeding or vag d/c changes. No pain at present. Pt states here for upt

## 2011-04-19 ENCOUNTER — Encounter (HOSPITAL_COMMUNITY): Payer: Self-pay | Admitting: *Deleted

## 2011-04-19 ENCOUNTER — Inpatient Hospital Stay (HOSPITAL_COMMUNITY): Payer: Medicaid Other

## 2011-04-19 ENCOUNTER — Inpatient Hospital Stay (HOSPITAL_COMMUNITY)
Admission: AD | Admit: 2011-04-19 | Discharge: 2011-04-19 | Disposition: A | Discharge status: Home / Self Care | Payer: Medicaid Other | Source: Ambulatory Visit | Attending: Obstetrics | Admitting: Obstetrics

## 2011-04-19 DIAGNOSIS — Z2989 Encounter for other specified prophylactic measures: Secondary | ICD-10-CM | POA: Insufficient documentation

## 2011-04-19 DIAGNOSIS — O360990 Maternal care for other rhesus isoimmunization, unspecified trimester, not applicable or unspecified: Secondary | ICD-10-CM

## 2011-04-19 DIAGNOSIS — O26899 Other specified pregnancy related conditions, unspecified trimester: Secondary | ICD-10-CM

## 2011-04-19 DIAGNOSIS — O209 Hemorrhage in early pregnancy, unspecified: Secondary | ICD-10-CM

## 2011-04-19 DIAGNOSIS — O2390 Unspecified genitourinary tract infection in pregnancy, unspecified trimester: Secondary | ICD-10-CM | POA: Insufficient documentation

## 2011-04-19 DIAGNOSIS — A499 Bacterial infection, unspecified: Secondary | ICD-10-CM | POA: Insufficient documentation

## 2011-04-19 DIAGNOSIS — B9689 Other specified bacterial agents as the cause of diseases classified elsewhere: Secondary | ICD-10-CM | POA: Insufficient documentation

## 2011-04-19 DIAGNOSIS — N760 Acute vaginitis: Secondary | ICD-10-CM | POA: Insufficient documentation

## 2011-04-19 DIAGNOSIS — O26859 Spotting complicating pregnancy, unspecified trimester: Secondary | ICD-10-CM | POA: Insufficient documentation

## 2011-04-19 DIAGNOSIS — Z298 Encounter for other specified prophylactic measures: Secondary | ICD-10-CM | POA: Insufficient documentation

## 2011-04-19 LAB — CBC
Hemoglobin: 11.2 g/dL — ABNORMAL LOW (ref 12.0–15.0)
MCHC: 32.7 g/dL (ref 30.0–36.0)
RDW: 14.8 % (ref 11.5–15.5)

## 2011-04-19 LAB — WET PREP, GENITAL: Trich, Wet Prep: NONE SEEN

## 2011-04-19 LAB — HCG, QUANTITATIVE, PREGNANCY: hCG, Beta Chain, Quant, S: 6451 m[IU]/mL — ABNORMAL HIGH (ref <5)

## 2011-04-19 MED ORDER — METRONIDAZOLE 500 MG PO TABS: 500.0000 mg | ORAL_TABLET | Freq: Two times a day (BID) | ORAL | Status: AC

## 2011-04-19 MED ORDER — RHO D IMMUNE GLOBULIN 1500 UNIT/2ML IJ SOLN
300.0000 ug | Freq: Once | INTRAMUSCULAR | Status: AC
  Administered 2011-04-19: 300 ug via INTRAMUSCULAR
  Filled 2011-04-19: qty 2

## 2011-04-19 NOTE — Progress Notes (Signed)
Pt awoke from a nap and noticed a mucosy blood tinged discharge-is worried tht something is wrong

## 2011-04-19 NOTE — ED Provider Notes (Signed)
History     Chief Complaint  Patient presents with  . Vaginal Bleeding   HPIMyishie L [name] is 25 y.o. [id number] [redacted]w[redacted]d weeks presenting with report of a "gush of blood with mucous" at 6pm tonight.  LMP 03/10/11.  States she has hx of DVT tx with Lovenox in 2007.  Delivered by C-Sect 7/22.  Unable to use OCA's because of DVT hx.  Slight cramping today bilateral, lower abdomen.  +UPT yesterday here when she presented for diarrhea.  Medication helped.  No more diarrhea.      Past Medical History  Diagnosis Date  . 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 DVT (deep venous thrombosis)   . Missed abortion 1/10    SP D&C  . SAB (spontaneous abortion) 1/09  . Anemia 2007    HGB 11.04 Feb 2009.  Marland Kitchen Deep vein thrombosis (DVT)     left leg    Past Surgical History  Procedure Date  . Cesarean section [id number]  . Dilation and curettage of uterus 1/10    For missed AB  . Tonselectomy 2010  . Cesarean section 11/20/2010    Procedure: CESAREAN SECTION;  Surgeon: Brock Bad, MD;  Location: [facility];  Service: Gynecology;  Laterality: N/A;  repeat cesarean section     Family History  Problem Relation Age of Onset  . Hyperlipidemia Mother   . Hypertension Mother   . Hypertension Father     History  Substance Use Topics  . Smoking status: Never Smoker   . Smokeless tobacco: Not on file  . Alcohol Use: No    Allergies: No Known Allergies  No prescriptions prior to admission    Review of Systems  Constitutional: Negative.   HENT: Negative.   Respiratory: Negative.   Gastrointestinal: Positive for abdominal pain (cramping).  Genitourinary:       Vaginal bleeding.   Physical Exam   Blood pressure 114/69, pulse 82, temperature 98.2 F (36.8 C), temperature source Oral, resp. rate 20, height 5\' 5"  (1.651 m), weight 152 lb (68.947 kg), last menstrual period 03/10/2011, SpO2 99.00%, not currently breastfeeding.  Physical Exam  Constitutional: She is oriented to  person, place, and time. She appears well-developed and well-nourished. No distress.  HENT:  Head: Normocephalic.  Neck: Normal range of motion.  Cardiovascular: Normal rate.   Respiratory: Effort normal.  GI: Soft. She exhibits no distension and no mass. There is no tenderness. There is no rebound and no guarding.  Genitourinary: Uterus is enlarged (slilghtly enlarged, soft) and tender (mild). Cervix exhibits no motion tenderness, no discharge and no friability. Right adnexum displays no mass, no tenderness and no fullness. Left adnexum displays no mass, no tenderness and no fullness. There is bleeding (small amount of bleeding from the os.  ) around the vagina. No tenderness around the vagina. No vaginal discharge found.  Neurological: She is alert and oriented to person, place, and time.  Skin: Skin is warm and dry.   Clinical Data: Pregnant, vaginal bleeding  OBSTETRIC <14 WK ULTRASOUND  Technique: Transabdominal ultrasound was performed for evaluation  of the gestation as well as the maternal uterus and adnexal  regions.  Comparison: None.  Intrauterine gestational sac: Visualized/normal in shape.  Yolk sac: Present  Embryo: Not visualized  MSD: 6.2 mm, corresponding to an estimated gestational age of [redacted]  weeks 1 day  [place] EDC: 12/19/2011  Maternal uterus/Adnexae:  No subchorionic hemorrhage.  Normal left ovary, measuring 1.5 x 2.9 x 2.2 cm.  Normal right ovary, measuring 1.9 x 2.6 x 1.3 cm.  No free fluid.  IMPRESSION:  Early intrauterine gestation with estimated gestational age [redacted] weeks  1 day by mean sac diameter.  Original Report Authenticated By: Charline Bills, M.D.     Results for orders placed during the hospital encounter of 04/19/11 (from the past 24 hour(s))  WET PREP, GENITAL     Status: Abnormal   Collection Time   04/19/11  8:05 PM      Component Value Range   Yeast, Wet Prep NONE SEEN  NONE SEEN    Trich, Wet Prep NONE SEEN  NONE SEEN    Clue Cells, Wet Prep  MODERATE (*) NONE SEEN    WBC, Wet Prep HPF POC FEW (*) NONE SEEN   CBC     Status: Abnormal   Collection Time   04/19/11  8:05 PM      Component Value Range   WBC 10.5  4.0 - 10.5 (K/uL)   RBC 4.06  3.87 - 5.11 (MIL/uL)   Hemoglobin 11.2 (*) 12.0 - 15.0 (g/dL)   HCT 95.6 (*) 21.3 - 46.0 (%)   MCV 84.2  78.0 - 100.0 (fL)   MCH 27.6  26.0 - 34.0 (pg)   MCHC 32.7  30.0 - 36.0 (g/dL)   RDW 08.6  57.8 - 46.9 (%)   Platelets 506 (*) 150 - 400 (K/uL)  HCG, QUANTITATIVE, PREGNANCY     Status: Abnormal   Collection Time   04/19/11  8:05 PM      Component Value Range   hCG, Beta Chain, Quant, S 6451 (*) <5 (mIU/mL)   BLOOD TYPE AB NEG from previous record. MAU Course  Procedures  GC/CHL culture to lab                     Rhophylac IM given (AB Neg, bleeding)  MDM     Assessment and Plan  A:  Early IUP  [redacted]w[redacted]d  with spotting     Bacterial Vaginosis     AB NEG blood type--RHOPHYLAC given  P;  Rx for Flagyl for  BV      Pt instructed to call Dr. Elsie [name] office on Monday for culture results       Make prenatal care appointment at that time.   Tiffany [name],EVE M 04/19/2011, 7:50 PM   Tiffany Holmes, NP 04/19/11 [id number]

## 2011-04-20 LAB — RH IG WORKUP (INCLUDES ABO/RH): Gestational Age(Wks): 5

## 2011-04-20 LAB — GC/CHLAMYDIA PROBE AMP, GENITAL
Chlamydia, DNA Probe: NEGATIVE
GC Probe Amp, Genital: NEGATIVE

## 2011-05-12 ENCOUNTER — Other Ambulatory Visit: Payer: Self-pay

## 2011-09-07 ENCOUNTER — Encounter (HOSPITAL_COMMUNITY): Payer: Self-pay | Admitting: *Deleted

## 2011-09-07 ENCOUNTER — Inpatient Hospital Stay (HOSPITAL_COMMUNITY)
Admission: AD | Admit: 2011-09-07 | Discharge: 2011-09-08 | Disposition: A | Discharge status: Home / Self Care | Payer: Medicaid Other | Source: Ambulatory Visit | Attending: Obstetrics | Admitting: Obstetrics

## 2011-09-07 DIAGNOSIS — N949 Unspecified condition associated with female genital organs and menstrual cycle: Secondary | ICD-10-CM | POA: Insufficient documentation

## 2011-09-07 DIAGNOSIS — B373 Candidiasis of vulva and vagina: Secondary | ICD-10-CM | POA: Insufficient documentation

## 2011-09-07 DIAGNOSIS — O2390 Unspecified genitourinary tract infection in pregnancy, unspecified trimester: Secondary | ICD-10-CM | POA: Insufficient documentation

## 2011-09-07 DIAGNOSIS — B3731 Acute candidiasis of vulva and vagina: Secondary | ICD-10-CM | POA: Insufficient documentation

## 2011-09-07 DIAGNOSIS — R350 Frequency of micturition: Secondary | ICD-10-CM | POA: Insufficient documentation

## 2011-09-07 DIAGNOSIS — R109 Unspecified abdominal pain: Secondary | ICD-10-CM | POA: Insufficient documentation

## 2011-09-07 LAB — URINALYSIS, ROUTINE W REFLEX MICROSCOPIC
Leukocytes, UA: NEGATIVE
Nitrite: NEGATIVE
Specific Gravity, Urine: >1.03 — ABNORMAL HIGH (ref 1.005–1.030)
pH: 5.5 (ref 5.0–8.0)

## 2011-09-07 LAB — WET PREP, GENITAL
Clue Cells Wet Prep HPF POC: NONE SEEN
Trich, Wet Prep: NONE SEEN
Yeast Wet Prep HPF POC: NONE SEEN

## 2011-09-07 MED ORDER — TERCONAZOLE 0.4 % VA CREA: 1.0000 | TOPICAL_CREAM | Freq: Every day | VAGINAL | Status: AC

## 2011-09-07 NOTE — Discharge Instructions (Signed)
Preventing Preterm Labor Preterm labor is when a pregnant woman has contractions that cause the cervix to open, shorten, and thin before 37 weeks of pregnancy. You will have regular contractions (tightening) 2 to 3 minutes apart. This usually causes discomfort or pain. HOME CARE  Eat a healthy diet.   Take your vitamins as told by your doctor.   Drink enough fluids to keep your pee (urine) clear or pale yellow every day.   Get rest and sleep.   Do not have sex if you are at high risk for preterm labor.   Follow your doctor's advice about activity, medicines, and tests.   Avoid stress.   Avoid hard labor or exercise that lasts for a long time.   Do not smoke.  GET HELP RIGHT AWAY IF:   You are having contractions.   You have belly (abdominal) pain.   You have bleeding from your vagina.   You have pain when you pee (urinate).   You have abnormal discharge from your vagina.   You have a temperature by mouth above 102 F (38.9 C).  MAKE SURE YOU:  Understand these instructions.   Will watch your condition.   Will get help if you are not doing well or get worse.  Document Released: 07/14/2008 Document Revised: 04/06/2011 Document Reviewed: 07/14/2008 Exit[employer] Patient Information 2012 [employer]  Preterm Labor Preterm labor is when labor starts at less than 37 weeks of pregnancy. The normal length of a pregnancy is 39 to 41 weeks. CAUSES Often, there is no identifiable underlying cause as to why a woman goes into preterm labor. However, one of the most common known causes of preterm labor is infection. Infections of the uterus, cervix, vagina, amniotic sac, bladder, kidney, or even the lungs (pneumonia) can cause labor to start. Other causes of preterm labor include:  Urogenital infections, such as yeast infections and bacterial vaginosis.   Uterine abnormalities (uterine shape, uterine septum, fibroids, bleeding from the placenta).   A cervix that has been  operated on and opens prematurely.   Malformations in the baby.   Multiple gestations (twins, triplets, and so on).   Breakage of the amniotic sac.  Additional risk factors for preterm labor include:  Previous history of preterm labor.   Premature rupture of membranes (PROM).   A placenta that covers the opening of the cervix (placenta previa).   A placenta that separates from the uterus (placenta abruption).   A cervix that is too weak to hold the baby in the uterus (incompetence cervix).   Having too much fluid in the amniotic sac (polyhydramnios).   Taking illegal drugs or smoking while pregnant.   Not gaining enough weight while pregnant.   Women younger than 18 and older than 27 years old.   Low socioeconomic status.   African-American ethnicity.  SYMPTOMS Signs and symptoms of preterm labor include:  Menstrual-like cramps.   Contractions that are 30 to 70 seconds apart, become very regular, closer together, and are more intense and painful.   Contractions that start on the top of the uterus and spread down to the lower abdomen and back.   A sense of increased pelvic pressure or back pain.   A watery or bloody discharge that comes from the vagina.  DIAGNOSIS  A diagnosis can be confirmed by:  A vaginal exam.   An ultrasound of the cervix.   Sampling (swabbing) cervico-vaginal secretions. These samples can be tested for the presence of fetal fibronectin. This is a   protein found in cervical discharge which is associated with preterm labor.   Fetal monitoring.  TREATMENT  Depending on the length of the pregnancy and other circumstances, a caregiver may suggest bed rest. If necessary, there are medicines that can be given to stop contractions and to quicken fetal lung maturity. If labor happens before 34 weeks of pregnancy, a prolonged hospital stay may be recommended. Treatment depends on the condition of both the mother and baby. PREVENTION There are some  things a mother can do to lower the risk of preterm labor in future pregnancies. A woman can:   Stop smoking.   Maintain healthy weight gain and avoid chemicals and drugs that are not necessary.   Be watchful for any type of infection.   Inform her caregiver if she has a known history of preterm labor.  Document Released: 07/08/2003 Document Revised: 04/06/2011 Document Reviewed: 08/12/2010 Exit[employer] Patient Information 2012 [employer]. 

## 2011-09-07 NOTE — MAU Note (Signed)
Pt presents with complaint of pressure in vagina and "pelvic area" x 5 hours, denies bleeding or ROM. Denies dysuria.denies problems with preg.Marland Kitchen

## 2011-09-07 NOTE — MAU Provider Note (Signed)
Chief Complaint:  Vaginal Pain   None     HPI  Tiffany [name] is  26 y.o. [id number] at [redacted]w[redacted]d presents with pelvic pressure since this evening. Denies contractions, leakage of fluid or vaginal bleeding. Good fetal movement.   Hx PTD at 36+ weeks.  Past Medical History: Past Medical History  Diagnosis Date  . 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 DVT (deep venous thrombosis)   . Missed abortion 1/10    SP D&C  . SAB (spontaneous abortion) 1/09  . Anemia 2007    HGB 11.04 Feb 2009.  Marland Kitchen Deep vein thrombosis (DVT)     left leg    Past Surgical History: Past Surgical History  Procedure Date  . Cesarean section [id number]  . Dilation and curettage of uterus 1/10    For missed AB  . Tonselectomy 2010  . Cesarean section 11/20/2010    Procedure: CESAREAN SECTION;  Surgeon: Brock Bad, MD;  Location: [facility];  Service: Gynecology;  Laterality: N/A;  repeat cesarean section     Family History: Family History  Problem Relation Age of Onset  . Hyperlipidemia Mother   . Hypertension Mother   . Hypertension Father     Social History: History  Substance Use Topics  . Smoking status: Never Smoker   . Smokeless tobacco: Not on file  . Alcohol Use: No    Allergies: No Known Allergies  Meds:  Prescriptions prior to admission  Medication Sig Dispense Refill  . enoxaparin (LOVENOX) 40 MG/0.4ML injection Inject 40 mg into the skin daily.      . prenatal vitamin w/FE, FA (NATACHEW) 29-1 MG CHEW Chew 1 tablet by mouth daily.            Physical Exam  Blood pressure 114/70, pulse 95, temperature 97.9 F (36.6 C), temperature source Oral, resp. rate 20, height 5\' 6"  (1.676 m), weight 74.844 kg (165 lb), last menstrual period 03/10/2011, SpO2 100.00%, unknown if currently breastfeeding. GENERAL: Well-developed, well-nourished female in no acute distress.  HEENT: normocephalic HEART: normal rate RESP: normal effort ABDOMEN: Soft, nontender, nondistended,  gravid.  EXTREMITIES: Nontender, no edema NEURO: alert and oriented  SPECULUM EXAM: Dilation: Closed Effacement (%): Thick Cervical Position: Posterior Station: -3 Presentation: Undeterminable Exam by:: Adison Jerger,CNM Thin white odorless discharge mixed w/ thick white discharge  FHT:  Baseline 150 , moderate variability, accelerations present, no decelerations Contractions: None   Labs: Results for orders placed during the hospital encounter of 09/07/11 (from the past 24 hour(s))  URINALYSIS, ROUTINE W REFLEX MICROSCOPIC     Status: Abnormal   Collection Time   09/07/11  9:10 PM      Component Value Range   Color, Urine YELLOW  YELLOW    APPearance CLEAR  CLEAR    Specific Gravity, Urine >1.030 (*) 1.005 - 1.030    pH 5.5  5.0 - 8.0    Glucose, UA NEGATIVE  NEGATIVE (mg/dL)   Hgb urine dipstick NEGATIVE  NEGATIVE    Bilirubin Urine NEGATIVE  NEGATIVE    Ketones, ur NEGATIVE  NEGATIVE (mg/dL)   Protein, ur NEGATIVE  NEGATIVE (mg/dL)   Urobilinogen, UA 1.0  0.0 - 1.0 (mg/dL)   Nitrite NEGATIVE  NEGATIVE    Leukocytes, UA NEGATIVE  NEGATIVE   WET PREP, GENITAL     Status: Abnormal   Collection Time   09/07/11 10:55 PM      Component Value Range   Yeast Wet Prep HPF POC NONE SEEN  NONE SEEN    Trich, Wet Prep NONE SEEN  NONE SEEN    Clue Cells Wet Prep HPF POC NONE SEEN  NONE SEEN    WBC, Wet Prep HPF POC MODERATE (*) NONE SEEN   POCT FERN TEST     Status: Normal   Collection Time   09/07/11 11:46 PM      Component Value Range   Fern Test Negative     Imaging:    Assessment: 1. Vulvovaginal candidiasis   2. Round ligament pain   3. Frequency of urination    Plan: D/C home Follow-up Information    Follow up with MARSHALL,BERNARD A, MD. (as scheduled or  as needed if symptoms worsen)    Contact information:   [address] [place] [address] [phone]         Medication List  As of 09/08/2011  1:49 AM   START taking these  medications         terconazole 0.4 % vaginal cream   Commonly known as: TERAZOL 7   Place 1 applicator vaginally at bedtime.         CONTINUE taking these medications         enoxaparin 40 MG/0.4ML injection   Commonly known as: LOVENOX      prenatal vitamin w/FE, FA 29-1 MG Chew          Where to get your medications    These are the prescriptions that you need to pick up.   You may get these medications from any pharmacy.         terconazole 0.4 % vaginal cream          PTL precautions   Dorathy Kinsman 5/9/201311:25 PM

## 2011-09-08 LAB — GC/CHLAMYDIA PROBE AMP, GENITAL
Chlamydia, DNA Probe: NEGATIVE
GC Probe Amp, Genital: NEGATIVE

## 2011-10-12 ENCOUNTER — Inpatient Hospital Stay (HOSPITAL_COMMUNITY)
Admission: AD | Admit: 2011-10-12 | Discharge: 2011-10-12 | Disposition: A | Discharge status: Home / Self Care | Payer: Medicaid Other | Source: Ambulatory Visit | Attending: Obstetrics | Admitting: Obstetrics

## 2011-10-12 ENCOUNTER — Other Ambulatory Visit: Payer: Self-pay | Admitting: Obstetrics

## 2011-10-12 DIAGNOSIS — Z3480 Encounter for supervision of other normal pregnancy, unspecified trimester: Secondary | ICD-10-CM | POA: Insufficient documentation

## 2011-10-12 DIAGNOSIS — Z2989 Encounter for other specified prophylactic measures: Secondary | ICD-10-CM | POA: Insufficient documentation

## 2011-10-12 DIAGNOSIS — Z298 Encounter for other specified prophylactic measures: Secondary | ICD-10-CM | POA: Insufficient documentation

## 2011-10-12 MED ORDER — RHO D IMMUNE GLOBULIN 1500 UNIT/2ML IJ SOLN
300.0000 ug | Freq: Once | INTRAMUSCULAR | Status: AC
  Administered 2011-10-12: 300 ug via INTRAMUSCULAR
  Filled 2011-10-12: qty 2

## 2011-10-13 LAB — RH IG WORKUP (INCLUDES ABO/RH)
Antibody Screen: NEGATIVE
Fetal Screen: NEGATIVE
Unit division: 0

## 2011-10-26 ENCOUNTER — Other Ambulatory Visit (HOSPITAL_COMMUNITY): Payer: Self-pay | Admitting: Obstetrics

## 2011-10-26 DIAGNOSIS — O2690 Pregnancy related conditions, unspecified, unspecified trimester: Secondary | ICD-10-CM

## 2011-10-31 ENCOUNTER — Ambulatory Visit (HOSPITAL_COMMUNITY)
Admission: RE | Admit: 2011-10-31 | Discharge: 2011-10-31 | Disposition: A | Discharge status: Home / Self Care | Payer: Medicaid Other | Source: Ambulatory Visit | Attending: Obstetrics | Admitting: Obstetrics

## 2011-10-31 ENCOUNTER — Encounter (HOSPITAL_COMMUNITY): Payer: Self-pay

## 2011-10-31 VITALS — BP 107/71 | HR 87 | Wt 167.2 lb

## 2011-10-31 DIAGNOSIS — O2690 Pregnancy related conditions, unspecified, unspecified trimester: Secondary | ICD-10-CM

## 2011-10-31 DIAGNOSIS — O34219 Maternal care for unspecified type scar from previous cesarean delivery: Secondary | ICD-10-CM | POA: Insufficient documentation

## 2011-10-31 DIAGNOSIS — O2620 Pregnancy care for patient with recurrent pregnancy loss, unspecified trimester: Secondary | ICD-10-CM | POA: Insufficient documentation

## 2011-10-31 DIAGNOSIS — I82409 Acute embolism and thrombosis of unspecified deep veins of unspecified lower extremity: Secondary | ICD-10-CM | POA: Insufficient documentation

## 2011-10-31 DIAGNOSIS — O2230 Deep phlebothrombosis in pregnancy, unspecified trimester: Secondary | ICD-10-CM | POA: Insufficient documentation

## 2011-10-31 DIAGNOSIS — Z8751 Personal history of pre-term labor: Secondary | ICD-10-CM | POA: Insufficient documentation

## 2011-11-10 ENCOUNTER — Ambulatory Visit (HOSPITAL_COMMUNITY)
Admission: RE | Admit: 2011-11-10 | Discharge: 2011-11-10 | Disposition: A | Discharge status: Home / Self Care | Payer: Medicaid Other | Source: Ambulatory Visit | Attending: Obstetrics | Admitting: Obstetrics

## 2011-11-10 ENCOUNTER — Encounter (HOSPITAL_COMMUNITY): Payer: Self-pay

## 2011-11-10 DIAGNOSIS — I82409 Acute embolism and thrombosis of unspecified deep veins of unspecified lower extremity: Secondary | ICD-10-CM | POA: Insufficient documentation

## 2011-11-10 DIAGNOSIS — O34219 Maternal care for unspecified type scar from previous cesarean delivery: Secondary | ICD-10-CM | POA: Insufficient documentation

## 2011-11-10 DIAGNOSIS — O2690 Pregnancy related conditions, unspecified, unspecified trimester: Secondary | ICD-10-CM

## 2011-11-10 DIAGNOSIS — O2620 Pregnancy care for patient with recurrent pregnancy loss, unspecified trimester: Secondary | ICD-10-CM | POA: Insufficient documentation

## 2011-11-10 DIAGNOSIS — Z8751 Personal history of pre-term labor: Secondary | ICD-10-CM | POA: Insufficient documentation

## 2011-11-10 NOTE — Progress Notes (Signed)
Patient seen today  for follow up ultrasound.  See full report in [facility].  Alpha Gula, MD  IUP at 35 0/7 weeks  Interval growth is appropriate (75th %tile) No gross anomalies identied The fetus is active with a BPP of 8/8 Normal amniotic fluid volume   Weekly BPPs

## 2011-11-13 ENCOUNTER — Other Ambulatory Visit: Payer: Self-pay | Admitting: Obstetrics

## 2011-11-15 ENCOUNTER — Encounter (HOSPITAL_COMMUNITY): Payer: Self-pay | Admitting: Anesthesiology

## 2011-11-15 ENCOUNTER — Inpatient Hospital Stay (HOSPITAL_COMMUNITY): Payer: Medicaid Other | Admitting: Anesthesiology

## 2011-11-15 ENCOUNTER — Encounter (HOSPITAL_COMMUNITY): Payer: Self-pay | Admitting: *Deleted

## 2011-11-15 ENCOUNTER — Inpatient Hospital Stay (HOSPITAL_COMMUNITY)
Admission: AD | Admit: 2011-11-15 | Discharge: 2011-11-15 | Disposition: A | Discharge status: Home / Self Care | Payer: Medicaid Other | Source: Ambulatory Visit | Attending: Obstetrics | Admitting: Obstetrics

## 2011-11-15 ENCOUNTER — Other Ambulatory Visit (HOSPITAL_COMMUNITY): Payer: Self-pay | Admitting: Obstetrics

## 2011-11-15 ENCOUNTER — Encounter (HOSPITAL_COMMUNITY)
Admission: AD | Disposition: A | Discharge status: Home / Self Care | Payer: Self-pay | Source: Ambulatory Visit | Attending: Obstetrics

## 2011-11-15 ENCOUNTER — Inpatient Hospital Stay (HOSPITAL_COMMUNITY)
Admission: AD | Admit: 2011-11-15 | Discharge: 2011-11-18 | DRG: 766 | Disposition: A | Discharge status: Home / Self Care | Payer: Medicaid Other | Source: Ambulatory Visit | Attending: Obstetrics | Admitting: Obstetrics

## 2011-11-15 ENCOUNTER — Encounter (HOSPITAL_COMMUNITY): Payer: Self-pay

## 2011-11-15 ENCOUNTER — Ambulatory Visit (HOSPITAL_COMMUNITY)
Admission: RE | Admit: 2011-11-15 | Discharge: 2011-11-15 | Disposition: A | Discharge status: Home / Self Care | Payer: Medicaid Other | Source: Ambulatory Visit | Attending: Obstetrics | Admitting: Obstetrics

## 2011-11-15 VITALS — BP 113/74 | HR 86 | Wt 168.0 lb

## 2011-11-15 DIAGNOSIS — O34219 Maternal care for unspecified type scar from previous cesarean delivery: Principal | ICD-10-CM | POA: Diagnosis present

## 2011-11-15 DIAGNOSIS — Z302 Encounter for sterilization: Secondary | ICD-10-CM

## 2011-11-15 DIAGNOSIS — O021 Missed abortion: Secondary | ICD-10-CM

## 2011-11-15 DIAGNOSIS — O2620 Pregnancy care for patient with recurrent pregnancy loss, unspecified trimester: Secondary | ICD-10-CM | POA: Insufficient documentation

## 2011-11-15 DIAGNOSIS — Z8751 Personal history of pre-term labor: Secondary | ICD-10-CM | POA: Insufficient documentation

## 2011-11-15 DIAGNOSIS — O2690 Pregnancy related conditions, unspecified, unspecified trimester: Secondary | ICD-10-CM

## 2011-11-15 DIAGNOSIS — I82409 Acute embolism and thrombosis of unspecified deep veins of unspecified lower extremity: Secondary | ICD-10-CM

## 2011-11-15 DIAGNOSIS — Z98891 History of uterine scar from previous surgery: Secondary | ICD-10-CM

## 2011-11-15 DIAGNOSIS — D6859 Other primary thrombophilia: Secondary | ICD-10-CM

## 2011-11-15 DIAGNOSIS — O039 Complete or unspecified spontaneous abortion without complication: Secondary | ICD-10-CM

## 2011-11-15 DIAGNOSIS — Z86718 Personal history of other venous thrombosis and embolism: Secondary | ICD-10-CM

## 2011-11-15 DIAGNOSIS — O4700 False labor before 37 completed weeks of gestation, unspecified trimester: Secondary | ICD-10-CM | POA: Insufficient documentation

## 2011-11-15 DIAGNOSIS — D649 Anemia, unspecified: Secondary | ICD-10-CM

## 2011-11-15 DIAGNOSIS — Z01812 Encounter for preprocedural laboratory examination: Secondary | ICD-10-CM

## 2011-11-15 LAB — URINALYSIS, ROUTINE W REFLEX MICROSCOPIC
Leukocytes, UA: NEGATIVE
Nitrite: NEGATIVE
Specific Gravity, Urine: 1.02 (ref 1.005–1.030)
Urobilinogen, UA: 0.2 mg/dL (ref 0.0–1.0)
pH: 6 (ref 5.0–8.0)

## 2011-11-15 LAB — CBC
HCT: 34 % — ABNORMAL LOW (ref 36.0–46.0)
MCH: 29.2 pg (ref 26.0–34.0)
MCHC: 33.2 g/dL (ref 30.0–36.0)
MCV: 87.9 fL (ref 78.0–100.0)
Platelets: 323 10*3/uL (ref 150–400)
RDW: 13.1 % (ref 11.5–15.5)

## 2011-11-15 LAB — TYPE AND SCREEN
ABO/RH(D): AB NEG
Antibody Screen: POSITIVE

## 2011-11-15 LAB — PROTIME-INR: INR: 1.08 (ref 0.00–1.49)

## 2011-11-15 SURGERY — Surgical Case
Anesthesia: Spinal

## 2011-11-15 MED ORDER — MORPHINE SULFATE (PF) 0.5 MG/ML IJ SOLN
INTRAMUSCULAR | Status: DC | PRN
  Administered 2011-11-15: .2 mg via INTRATHECAL

## 2011-11-15 MED ORDER — LACTATED RINGERS IV SOLN
INTRAVENOUS | Status: DC | PRN
  Administered 2011-11-15: 23:00:00 via INTRAVENOUS

## 2011-11-15 MED ORDER — TERBUTALINE SULFATE 1 MG/ML IJ SOLN
INTRAMUSCULAR | Status: AC
  Filled 2011-11-15: qty 1

## 2011-11-15 MED ORDER — LACTATED RINGERS IV BOLUS (SEPSIS)
1000.0000 mL | INTRAVENOUS | Status: AC
  Administered 2011-11-15: 1000 mL via INTRAVENOUS

## 2011-11-15 MED ORDER — TERBUTALINE SULFATE 1 MG/ML IJ SOLN
0.2500 mg | INTRAMUSCULAR | Status: AC
  Administered 2011-11-15: 0.25 mg via SUBCUTANEOUS

## 2011-11-15 MED ORDER — FAMOTIDINE IN NACL 20-0.9 MG/50ML-% IV SOLN
20.0000 mg | Freq: Once | INTRAVENOUS | Status: AC
  Administered 2011-11-15: 20 mg via INTRAVENOUS
  Filled 2011-11-15: qty 50

## 2011-11-15 MED ORDER — CITRIC ACID-SODIUM CITRATE 334-500 MG/5ML PO SOLN: 30.0000 mL | Freq: Once | ORAL | Status: DC

## 2011-11-15 MED ORDER — OXYTOCIN 10 UNIT/ML IJ SOLN
40.0000 [IU] | INTRAVENOUS | Status: DC | PRN
  Administered 2011-11-15: 40 [IU] via INTRAVENOUS

## 2011-11-15 MED ORDER — CEFAZOLIN SODIUM-DEXTROSE 2-3 GM-% IV SOLR
2.0000 g | Freq: Three times a day (TID) | INTRAVENOUS | Status: DC
  Administered 2011-11-15 – 2011-11-17 (×6): 2 g via INTRAVENOUS
  Filled 2011-11-15 (×7): qty 50

## 2011-11-15 MED ORDER — TERBUTALINE SULFATE 1 MG/ML IJ SOLN
0.2500 mg | Freq: Once | INTRAMUSCULAR | Status: AC
  Administered 2011-11-15: 0.25 mg via SUBCUTANEOUS
  Filled 2011-11-15: qty 1

## 2011-11-15 MED ORDER — ONDANSETRON HCL 4 MG/2ML IJ SOLN
INTRAMUSCULAR | Status: DC | PRN
  Administered 2011-11-15: 4 mg via INTRAVENOUS

## 2011-11-15 MED ORDER — TERBUTALINE SULFATE 1 MG/ML IJ SOLN
0.2500 mg | Freq: Once | INTRAMUSCULAR | Status: DC
  Filled 2011-11-15: qty 1

## 2011-11-15 MED ORDER — CITRIC ACID-SODIUM CITRATE 334-500 MG/5ML PO SOLN
ORAL | Status: AC
  Administered 2011-11-15: 30 mL
  Filled 2011-11-15: qty 15

## 2011-11-15 MED ORDER — BUPIVACAINE IN DEXTROSE 0.75-8.25 % IT SOLN
INTRATHECAL | Status: DC | PRN
  Administered 2011-11-15: 1.5 mL via INTRATHECAL

## 2011-11-15 MED ORDER — FENTANYL CITRATE 0.05 MG/ML IJ SOLN
INTRAMUSCULAR | Status: DC | PRN
  Administered 2011-11-15: 25 ug via INTRATHECAL

## 2011-11-15 MED ORDER — PHENYLEPHRINE HCL 10 MG/ML IJ SOLN
INTRAMUSCULAR | Status: DC | PRN
  Administered 2011-11-15: 120 ug via INTRAVENOUS
  Administered 2011-11-15 (×5): 80 ug via INTRAVENOUS

## 2011-11-15 MED ORDER — CEFAZOLIN SODIUM 1-5 GM-% IV SOLN
INTRAVENOUS | Status: DC | PRN
  Administered 2011-11-15: 2 g via INTRAVENOUS

## 2011-11-15 SURGICAL SUPPLY — 31 items
CLOTH BEACON ORANGE TIMEOUT ST (SAFETY) ×2 IMPLANT
DERMABOND ADVANCED (GAUZE/BANDAGES/DRESSINGS) ×2
DERMABOND ADVANCED .7 DNX12 (GAUZE/BANDAGES/DRESSINGS) ×2 IMPLANT
DRSG COVADERM 4X10 (GAUZE/BANDAGES/DRESSINGS) ×2 IMPLANT
ELECT REM PT RETURN 9FT ADLT (ELECTROSURGICAL) ×2
ELECTRODE REM PT RTRN 9FT ADLT (ELECTROSURGICAL) ×1 IMPLANT
EXTRACTOR VACUUM M CUP 4 TUBE (SUCTIONS) IMPLANT
GLOVE BIO SURGEON STRL SZ8.5 (GLOVE) ×4 IMPLANT
GLOVE INDICATOR 7.5 STRL GRN (GLOVE) ×2 IMPLANT
GLOVE SURG SS PI 7.5 STRL IVOR (GLOVE) ×2 IMPLANT
GOWN PREVENTION PLUS LG XLONG (DISPOSABLE) ×4 IMPLANT
GOWN PREVENTION PLUS XXLARGE (GOWN DISPOSABLE) ×2 IMPLANT
KIT ABG SYR 3ML LUER SLIP (SYRINGE) IMPLANT
NEEDLE HYPO 25X5/8 SAFETYGLIDE (NEEDLE) ×2 IMPLANT
NS IRRIG 1000ML POUR BTL (IV SOLUTION) ×2 IMPLANT
PACK C SECTION WH (CUSTOM PROCEDURE TRAY) ×2 IMPLANT
SLEEVE SCD COMPRESS KNEE MED (MISCELLANEOUS) ×2 IMPLANT
SUT CHROMIC 0 CT 802H (SUTURE) ×2 IMPLANT
SUT CHROMIC 1 CTX 36 (SUTURE) ×4 IMPLANT
SUT CHROMIC 2 0 SH (SUTURE) ×2 IMPLANT
SUT GUT PLAIN 0 CT-3 TAN 27 (SUTURE) IMPLANT
SUT MNCRL AB 4-0 PS2 18 (SUTURE) ×6 IMPLANT
SUT MON AB 4-0 PS1 27 (SUTURE) ×2 IMPLANT
SUT VIC AB 0 CT1 18XCR BRD8 (SUTURE) IMPLANT
SUT VIC AB 0 CT1 8-18 (SUTURE)
SUT VIC AB 0 CTX 36 (SUTURE) ×2
SUT VIC AB 0 CTX36XBRD ANBCTRL (SUTURE) ×2 IMPLANT
TOWEL OR 17X24 6PK STRL BLUE (TOWEL DISPOSABLE) ×4 IMPLANT
TRAY FOLEY CATH 14FR (SET/KITS/TRAYS/PACK) IMPLANT
TRAY FOLEY CATH 16FR SILVER (SET/KITS/TRAYS/PACK) ×2 IMPLANT
WATER STERILE IRR 1000ML POUR (IV SOLUTION) ×2 IMPLANT

## 2011-11-15 NOTE — MAU Note (Signed)
Dr. Gaynell Face notified pt in MAU for preterm labor, ctx's noted q2-6 minutes apart. Cervix 3-4/90/-2 vertex, membranes intact. Pt on heparin therapy. Prior c/s x3. Orders to iv hydrate, and administer terbutaline 0.25 mg sq, may repeat in 30 minutes if needed.

## 2011-11-15 NOTE — MAU Note (Signed)
Pt  Presents with worsening contractions. Scheduled for C/S on 12/06/2011. Denies vaginal bleeding or ROM

## 2011-11-15 NOTE — MAU Note (Signed)
Dr. Gaynell Face called RN [id number] of terbutaline. Notified that pt states ctx's are less consistent, less painful, rates 1/10. EFM tracing reactive. RN to get pt to call MD office for rx for procardia, and return if ctx's are 5 or more in one hour. Dr. Gaynell Face notified pt has appt for 1300 today in his office. Pt to now come to MD office at 1400.

## 2011-11-15 NOTE — MAU Note (Signed)
Pt states has been ctxing for past 3 weeks, for past 2 days, ctx's have been back to back, told MD office Monday when she picked up her syringes for meds that she was ctxing regularly and was told to keep appt at 1300 today. Pt states no bleeding, is unsure if she is "peeing" on herself.

## 2011-11-15 NOTE — Progress Notes (Signed)
Patient seen for follow up BPP.  See report in [facility].  Alpha Gula, MD  IUP at 35 5/7 weeks  The fetus is active with a BPP of 8/8 Normal amniotic fluid volume   Continue weekly BPPs.  If otherwise stable, would recommend delivery at 39 weeks.

## 2011-11-15 NOTE — H&P (Signed)
This is Dr. Francoise Ceo dictating the history and physical on  Tiffany [name] she's a 26 year old gravida 8 para 21 for 3 was had 3 see previous C-sections he is a 36 weeks her EDC is 12/13/2011 she's a been negative received RhoGAM patient has a history of DVTs an old in 2007 and she has been on Lovenox and should have been started on heparin on Monday but she did not take her heparin and her last Lovenox 40 subcutaneous was greater than 24 hours ago she came to the hospital today with irregular contractions and received 2 doses of terbutaline and her contractions stopped and she was sent home to comes in contracting 2-3 minutes apart good carotid the cervix 6 cm him bulging membranes she is for repeat C-section and tubal ligation her GBS results are unavailable and she's been started on Ancef 2 g IV every 6 hours Past medical history a history of the length Koff DVT in 2007 Past surgical history 3 C-sections in the past Social history negative System review negative Physical exam revealed a well-developed female breasts negative Heart regular rhythm no murmurs no gallops Lungs clear to P&A Abdomen 36 week size Cervix as described above Extremities negative

## 2011-11-15 NOTE — MAU Provider Note (Signed)
Speculum exam done.  No leaking seen.  Minimal white discharge.  Membranes seen in the cervix.  No evidence of ROM.  Cervix 3-4 cm, 90% effaced.  Previous C/S.  Contracting q 3-4 minutes.  RN to notify Dr. Gaynell Face

## 2011-11-15 NOTE — Anesthesia Preprocedure Evaluation (Signed)
Anesthesia Evaluation  Patient identified by MRN, date of birth, ID band Patient awake    Reviewed: Allergy & Precautions, H&P , NPO status , Patient's Chart, lab work & pertinent test results  Airway Mallampati: I TM Distance: >3 FB Neck ROM: full    Dental No notable dental hx. (+) Teeth Intact   Pulmonary neg pulmonary ROS,  breath sounds clear to auscultation  Pulmonary exam normal       Cardiovascular negative cardio ROS      Neuro/Psych negative neurological ROS  negative psych ROS   GI/Hepatic negative GI ROS, Neg liver ROS,   Endo/Other  negative endocrine ROS  Renal/GU negative Renal ROS  negative genitourinary   Musculoskeletal negative musculoskeletal ROS (+)   Abdominal Normal abdominal exam  (+)   Peds negative pediatric ROS (+)  Hematology negative hematology ROS (+)   Anesthesia Other Findings   Reproductive/Obstetrics (+) Pregnancy                           Anesthesia Physical Anesthesia Plan  ASA: II  Anesthesia Plan: Spinal   Post-op Pain Management:    Induction:   Airway Management Planned:   Additional Equipment:   Intra-op Plan:   Post-operative Plan:   Informed Consent: I have reviewed the patients History and Physical, chart, labs and discussed the procedure including the risks, benefits and alternatives for the proposed anesthesia with the patient or authorized representative who has indicated his/her understanding and acceptance.     Plan Discussed with: CRNA and Surgeon  Anesthesia Plan Comments:         Anesthesia Quick Evaluation

## 2011-11-15 NOTE — Anesthesia Procedure Notes (Signed)
Spinal  Patient location during procedure: OR Start time: 11/15/2011 11:06 PM End time: 11/15/2011 11:09 PM Staffing Anesthesiologist: Sandrea Hughs Performed by: anesthesiologist  Preanesthetic Checklist Completed: patient identified, site marked, surgical consent, pre-op evaluation, timeout performed, IV checked, risks and benefits discussed and monitors and equipment checked Spinal Block Patient position: sitting Prep: DuraPrep Patient monitoring: heart rate, cardiac monitor, continuous pulse ox and blood pressure Approach: midline Location: L3-4 Injection technique: single-shot Needle Needle type: Sprotte  Needle gauge: 24 G Needle length: 9 cm Needle insertion depth: 7 cm Assessment Sensory level: T4

## 2011-11-16 ENCOUNTER — Encounter (HOSPITAL_COMMUNITY): Payer: Self-pay

## 2011-11-16 LAB — CBC
HCT: 31.4 % — ABNORMAL LOW (ref 36.0–46.0)
MCHC: 33.1 g/dL (ref 30.0–36.0)
MCV: 88 fL (ref 78.0–100.0)
Platelets: 305 10*3/uL (ref 150–400)
RDW: 13.1 % (ref 11.5–15.5)

## 2011-11-16 MED ORDER — DIPHENHYDRAMINE HCL 25 MG PO CAPS
25.0000 mg | ORAL_CAPSULE | ORAL | Status: DC | PRN
  Administered 2011-11-16: 25 mg via ORAL

## 2011-11-16 MED ORDER — METOCLOPRAMIDE HCL 5 MG/ML IJ SOLN: 10.0000 mg | Freq: Three times a day (TID) | INTRAMUSCULAR | Status: DC | PRN

## 2011-11-16 MED ORDER — SCOPOLAMINE 1 MG/3DAYS TD PT72
MEDICATED_PATCH | TRANSDERMAL | Status: AC
  Filled 2011-11-16: qty 1

## 2011-11-16 MED ORDER — RHO D IMMUNE GLOBULIN 1500 UNIT/2ML IJ SOLN
300.0000 ug | Freq: Once | INTRAMUSCULAR | Status: AC
  Administered 2011-11-16: 300 ug via INTRAMUSCULAR
  Filled 2011-11-16: qty 2

## 2011-11-16 MED ORDER — NALBUPHINE HCL 10 MG/ML IJ SOLN
5.0000 mg | INTRAMUSCULAR | Status: DC | PRN
  Administered 2011-11-16: 5 mg via INTRAVENOUS
  Administered 2011-11-16: 10 mg via INTRAVENOUS
  Filled 2011-11-16 (×3): qty 1

## 2011-11-16 MED ORDER — MEPERIDINE HCL 25 MG/ML IJ SOLN: 6.2500 mg | INTRAMUSCULAR | Status: DC | PRN

## 2011-11-16 MED ORDER — DIBUCAINE 1 % RE OINT: 1.0000 "application " | TOPICAL_OINTMENT | RECTAL | Status: DC | PRN

## 2011-11-16 MED ORDER — DIPHENHYDRAMINE HCL 50 MG/ML IJ SOLN: 12.5000 mg | INTRAMUSCULAR | Status: DC | PRN

## 2011-11-16 MED ORDER — HYDROMORPHONE HCL PF 1 MG/ML IJ SOLN
1.0000 mg | INTRAMUSCULAR | Status: DC | PRN
  Administered 2011-11-16: 1 mg via INTRAVENOUS
  Filled 2011-11-16: qty 1

## 2011-11-16 MED ORDER — PROMETHAZINE HCL 25 MG/ML IJ SOLN: 6.2500 mg | INTRAMUSCULAR | Status: DC | PRN

## 2011-11-16 MED ORDER — SCOPOLAMINE 1 MG/3DAYS TD PT72
1.0000 | MEDICATED_PATCH | Freq: Once | TRANSDERMAL | Status: DC
  Administered 2011-11-16: 1.5 mg via TRANSDERMAL

## 2011-11-16 MED ORDER — ZOLPIDEM TARTRATE 5 MG PO TABS: 5.0000 mg | ORAL_TABLET | Freq: Every evening | ORAL | Status: DC | PRN

## 2011-11-16 MED ORDER — IBUPROFEN 600 MG PO TABS
600.0000 mg | ORAL_TABLET | Freq: Four times a day (QID) | ORAL | Status: DC
  Administered 2011-11-16 – 2011-11-18 (×7): 600 mg via ORAL
  Filled 2011-11-16 (×7): qty 1

## 2011-11-16 MED ORDER — KETOROLAC TROMETHAMINE 30 MG/ML IJ SOLN: 15.0000 mg | Freq: Once | INTRAMUSCULAR | Status: DC | PRN

## 2011-11-16 MED ORDER — ONDANSETRON HCL 4 MG/2ML IJ SOLN: 4.0000 mg | INTRAMUSCULAR | Status: DC | PRN

## 2011-11-16 MED ORDER — KETOROLAC TROMETHAMINE 30 MG/ML IJ SOLN: 30.0000 mg | Freq: Four times a day (QID) | INTRAMUSCULAR | Status: AC | PRN

## 2011-11-16 MED ORDER — WITCH HAZEL-GLYCERIN EX PADS: 1.0000 "application " | MEDICATED_PAD | CUTANEOUS | Status: DC | PRN

## 2011-11-16 MED ORDER — LANOLIN HYDROUS EX OINT: 1.0000 "application " | TOPICAL_OINTMENT | CUTANEOUS | Status: DC | PRN

## 2011-11-16 MED ORDER — NALOXONE HCL 0.4 MG/ML IJ SOLN: 0.4000 mg | INTRAMUSCULAR | Status: DC | PRN

## 2011-11-16 MED ORDER — TETANUS-DIPHTH-ACELL PERTUSSIS 5-2.5-18.5 LF-MCG/0.5 IM SUSP: 0.5000 mL | Freq: Once | INTRAMUSCULAR | Status: DC

## 2011-11-16 MED ORDER — HYDROMORPHONE HCL PF 1 MG/ML IJ SOLN: 0.2500 mg | INTRAMUSCULAR | Status: DC | PRN

## 2011-11-16 MED ORDER — ENOXAPARIN SODIUM 40 MG/0.4ML ~~LOC~~ SOLN
40.0000 mg | SUBCUTANEOUS | Status: DC
  Administered 2011-11-16 – 2011-11-17 (×2): 40 mg via SUBCUTANEOUS
  Filled 2011-11-16 (×2): qty 0.4

## 2011-11-16 MED ORDER — KETOROLAC TROMETHAMINE 60 MG/2ML IM SOLN
60.0000 mg | Freq: Once | INTRAMUSCULAR | Status: AC | PRN
  Administered 2011-11-16: 60 mg via INTRAMUSCULAR

## 2011-11-16 MED ORDER — OXYTOCIN 40 UNITS IN LACTATED RINGERS INFUSION - SIMPLE MED: 62.5000 mL/h | INTRAVENOUS | Status: AC

## 2011-11-16 MED ORDER — PRENATAL MULTIVITAMIN CH
1.0000 | ORAL_TABLET | Freq: Every day | ORAL | Status: DC
  Administered 2011-11-17 – 2011-11-18 (×2): 1 via ORAL
  Filled 2011-11-16 (×2): qty 1

## 2011-11-16 MED ORDER — MENTHOL 3 MG MT LOZG: 1.0000 | LOZENGE | OROMUCOSAL | Status: DC | PRN

## 2011-11-16 MED ORDER — LACTATED RINGERS IV SOLN
INTRAVENOUS | Status: DC
  Administered 2011-11-16 (×2): via INTRAVENOUS

## 2011-11-16 MED ORDER — ONDANSETRON HCL 4 MG/2ML IJ SOLN: 4.0000 mg | Freq: Three times a day (TID) | INTRAMUSCULAR | Status: DC | PRN

## 2011-11-16 MED ORDER — SIMETHICONE 80 MG PO CHEW
80.0000 mg | CHEWABLE_TABLET | Freq: Three times a day (TID) | ORAL | Status: DC
  Administered 2011-11-16 – 2011-11-18 (×7): 80 mg via ORAL

## 2011-11-16 MED ORDER — SIMETHICONE 80 MG PO CHEW
80.0000 mg | CHEWABLE_TABLET | ORAL | Status: DC | PRN
  Administered 2011-11-16: 80 mg via ORAL

## 2011-11-16 MED ORDER — DIPHENHYDRAMINE HCL 50 MG/ML IJ SOLN: 25.0000 mg | INTRAMUSCULAR | Status: DC | PRN

## 2011-11-16 MED ORDER — OXYCODONE-ACETAMINOPHEN 5-325 MG PO TABS
1.0000 | ORAL_TABLET | ORAL | Status: DC | PRN
  Administered 2011-11-16 – 2011-11-18 (×7): 2 via ORAL
  Administered 2011-11-18: 1 via ORAL
  Filled 2011-11-16: qty 1
  Filled 2011-11-16: qty 2
  Filled 2011-11-16: qty 1
  Filled 2011-11-16 (×5): qty 2
  Filled 2011-11-16: qty 1

## 2011-11-16 MED ORDER — ONDANSETRON HCL 4 MG PO TABS: 4.0000 mg | ORAL_TABLET | ORAL | Status: DC | PRN

## 2011-11-16 MED ORDER — SENNOSIDES-DOCUSATE SODIUM 8.6-50 MG PO TABS
2.0000 | ORAL_TABLET | Freq: Every day | ORAL | Status: DC
  Administered 2011-11-16 – 2011-11-17 (×2): 2 via ORAL

## 2011-11-16 MED ORDER — SODIUM CHLORIDE 0.9 % IJ SOLN: 3.0000 mL | INTRAMUSCULAR | Status: DC | PRN

## 2011-11-16 MED ORDER — NALBUPHINE HCL 10 MG/ML IJ SOLN
5.0000 mg | INTRAMUSCULAR | Status: DC | PRN
  Filled 2011-11-16: qty 1

## 2011-11-16 MED ORDER — KETOROLAC TROMETHAMINE 60 MG/2ML IM SOLN
INTRAMUSCULAR | Status: AC
  Filled 2011-11-16: qty 2

## 2011-11-16 MED ORDER — DIPHENHYDRAMINE HCL 25 MG PO CAPS: 25.0000 mg | ORAL_CAPSULE | Freq: Four times a day (QID) | ORAL | Status: DC | PRN

## 2011-11-16 MED ORDER — SODIUM CHLORIDE 0.9 % IV SOLN
1.0000 ug/kg/h | INTRAVENOUS | Status: DC | PRN
  Filled 2011-11-16: qty 2.5

## 2011-11-16 NOTE — Anesthesia Postprocedure Evaluation (Signed)
Anesthesia Post Note  Patient: Tiffany [name]  Procedure(s) Performed: Procedure(s) (LRB): CESAREAN SECTION (N/A)  Anesthesia type: Spinal  Patient location: PACU  Post pain: Pain level controlled  Post assessment: Post-op Vital signs reviewed  Last Vitals:  Filed Vitals:   11/16/11 1820  BP: 135/73  Pulse: 70  Temp: 36.6 C  Resp: 24    Post vital signs: Reviewed  Level of consciousness: awake  Complications: No apparent anesthesia complications

## 2011-11-16 NOTE — Transfer of Care (Signed)
Immediate Anesthesia Transfer of Care Note  Patient: Tiffany [name]  Procedure(s) Performed: Procedure(s) (LRB): CESAREAN SECTION (N/A)  Patient Location: PACU  Anesthesia Type: Spinal  Level of Consciousness: awake, alert  and oriented  Airway & Oxygen Therapy: Patient Spontanous Breathing  Post-op Assessment: Report given to PACU RN  Post vital signs: Reviewed and stable  Complications: No apparent anesthesia complications

## 2011-11-16 NOTE — Op Note (Signed)
preop diagnosis previous cesarean section at term x3 at [redacted] weeks gestation in active labor Postop diagnosis repeat low transverse cesarean section and tubal ligation Anesthesia spinal Surgeon Dr. Francoise Ceo First assistant Dr. Jean Rosenthal more Procedure patient placed on the operating table in the supine position abdomen prepped and draped bladder emptied with a Foley catheter a transverse suprapubic incision made  through old scar carried down to the rectus fascia fascia cleaned and incised the length of the incision recti muscles retracted laterally peritoneum incised  transverse incision made on the lower uterine segment which was rather thin fluid clear patient delivered from the LOP position of a female Apgar 9 and 9 the team was in attendance the placenta was fundal removed manually and sent to pathology uterine cavity clean with dry laps the uterine incision closed in one layer with continuous   one chromic hemostasis was satisfactory the right tube was grasped in the midportion with a Babcock clamp and 0 plain suture placed in the mesosalpinx below the portion of tube within the clamp this was tied  and approximately 1 inch of tube transected the procedure was done in a similar fashion on the other side hemostasis satisfactory lap and sponge counts correct abdomen closed in layers peritoneum continuous with 2-0 chromic fascia continuous within of 0 Dexon and the skin shows a subcuticular stitch of 40 plain the blood loss was  700cc patient tolerated the procedure well

## 2011-11-16 NOTE — Progress Notes (Signed)
Patient c/o pain 7/10 at incison that is constant even with lying still. She had 60 mg toradol at 0111. Dr. Arby Barrette notified and order received. Will continue to monitor patient.

## 2011-11-16 NOTE — Progress Notes (Signed)
UR Chart review completed.  

## 2011-11-16 NOTE — Progress Notes (Signed)
Patient ID: Tiffany [name], female   DOB: Feb 27, 1986, 26 y.o.   MRN: [id number] Postop day 1 Vital signs normal Fundus firm Lochia moderate Legs negative She'll be restarted on Lovenox at midnight tonight

## 2011-11-17 LAB — RH IG WORKUP (INCLUDES ABO/RH)

## 2011-11-17 NOTE — Progress Notes (Signed)
Patient ID: Tiffany [name], female   DOB: 11/21/1985, 26 y.o.   MRN: [id number] Postop day 2 Vital signs normal Fundus firm Incision clean and dry Legs negative Got her Lovenox 40 mg subcutaneous last night first dose since delivery

## 2011-11-18 MED ORDER — ENOXAPARIN SODIUM 40 MG/0.4ML ~~LOC~~ SOLN: 40.0000 mg | SUBCUTANEOUS | Status: DC

## 2011-11-18 NOTE — Discharge Summary (Signed)
Obstetric Discharge Summary Reason for Admission: onset of labor Prenatal Procedures: none Intrapartum Procedures: cesarean: low cervical, transverse and tubal ligation Postpartum Procedures: none Complications-Operative and Postpartum: none Hemoglobin  Date Value Range Status  11/16/2011 10.4* 12.0 - 15.0 g/dL Final     HCT  Date Value Range Status  11/16/2011 31.4* 36.0 - 46.0 % Final    Physical Exam:  General: alert Lochia: appropriate Uterine Fundus: firm Incision: healing well DVT Evaluation: No evidence of DVT seen on physical exam.  Discharge Diagnoses: Premature labor  Discharge Information: Date: 11/18/2011 Activity: pelvic rest Diet: routine Medications: Percocet Condition: stable Instructions: refer to practice specific booklet Discharge to: home Follow-up Information    Follow up with Mya Suell A, MD. Call in 6 weeks.   Contact information:   [address] [place] [address] [phone]          Newborn Data: Live born female  Birth Weight: 6 lb 3.7 oz (2825 g) APGAR: 9, 9  Home with mother.  Rilynn Habel A 11/18/2011, 6:29 AM

## 2011-11-18 NOTE — Progress Notes (Signed)
Patient ID: Tiffany [name], female   DOB: 13-Jul-1985, 26 y.o.   MRN: [id number] Postop day 3 Vital signs normal Fundus firm Lochia moderate Incision clean and dry Legs negative patient is a discharge today on Percocet and Lovenox 40 mg subcutaneous daily

## 2011-11-22 ENCOUNTER — Ambulatory Visit (HOSPITAL_COMMUNITY): Payer: Medicaid Other

## 2011-11-23 ENCOUNTER — Encounter (HOSPITAL_COMMUNITY): Payer: Self-pay | Admitting: Obstetrics

## 2012-01-11 IMAGING — US US OB COMP LESS 14 WK
1 series · 14 of 24 positions shown · non-contrast
Comparison: None.

CLINICAL DATA: Pregnant, vaginal bleeding

OBSTETRIC <14 WK ULTRASOUND
TECHNIQUE: Transabdominal ultrasound was performed for evaluation
of the gestation as well as the maternal uterus and adnexal
regions.

[Series 1: us ob comp less 14 wks · 14 of 24 slices shown]
[im 1/24]
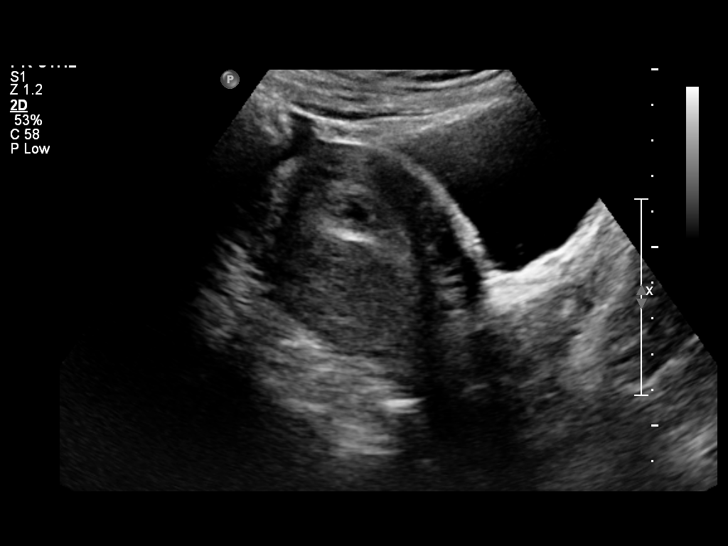
[im 3/24]
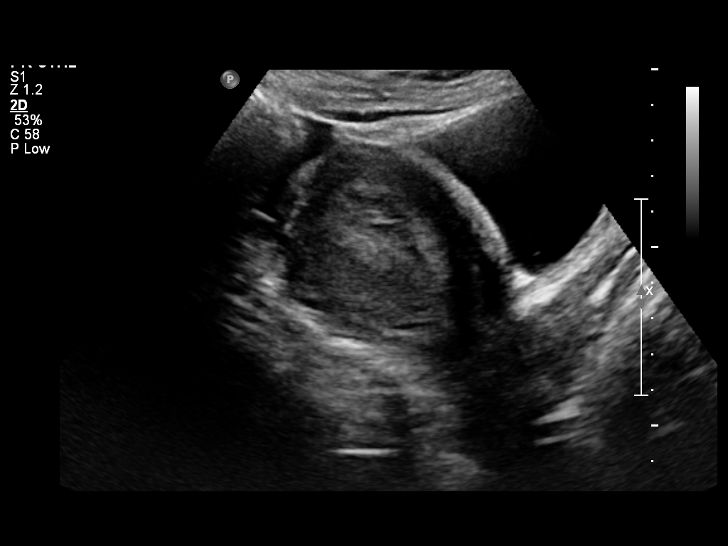
[im 5/24]
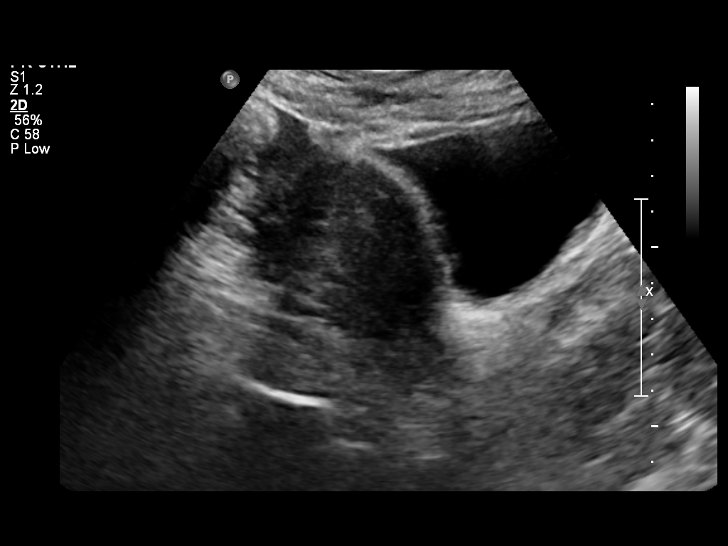
[im 7/24]
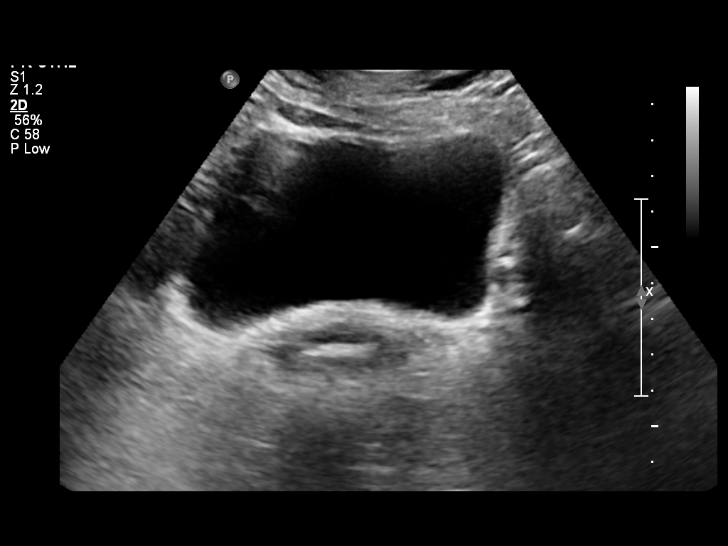
[im 8/24]
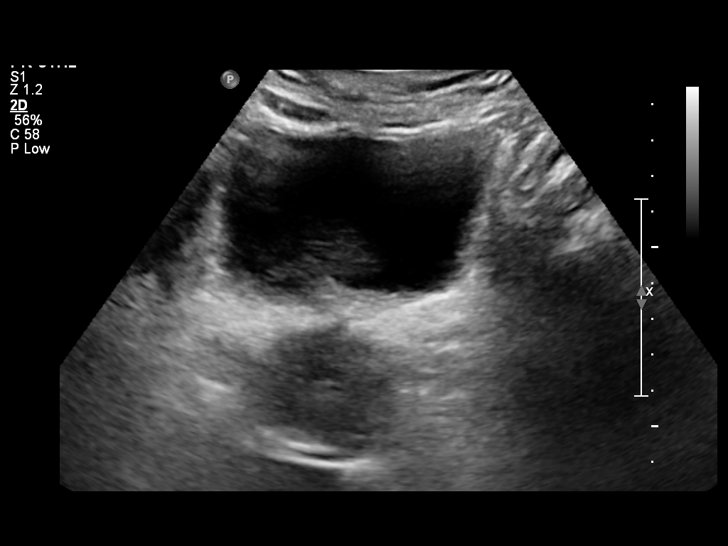
[im 10/24]
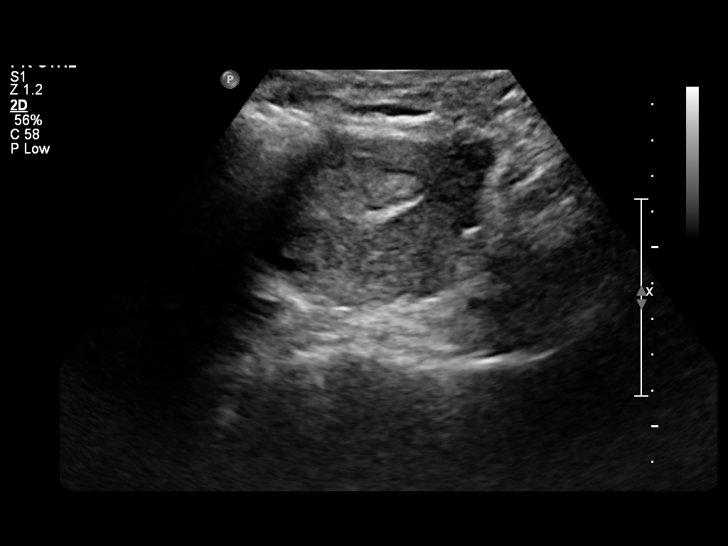
[im 12/24]
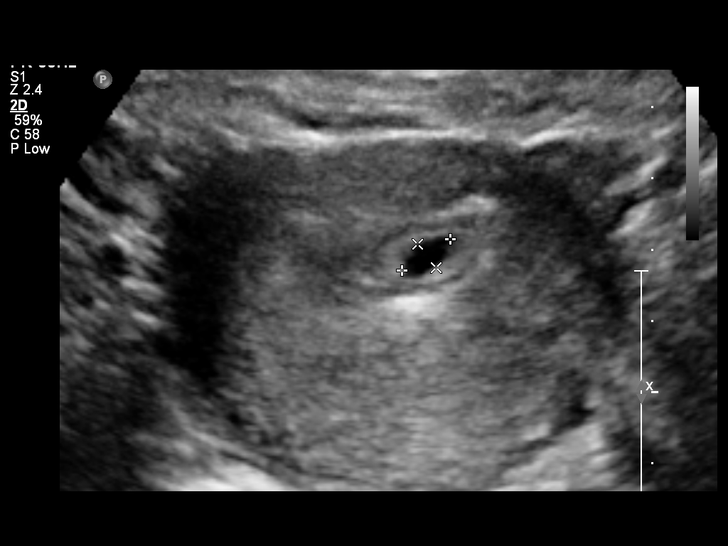
[im 13/24]
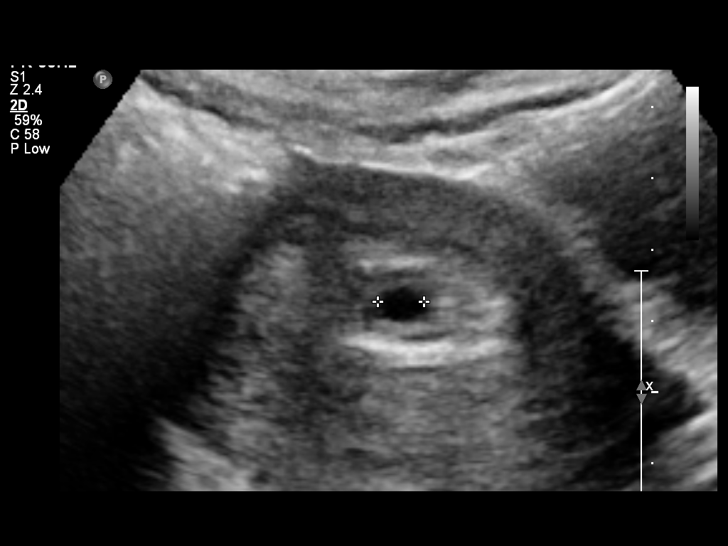
[im 15/24]
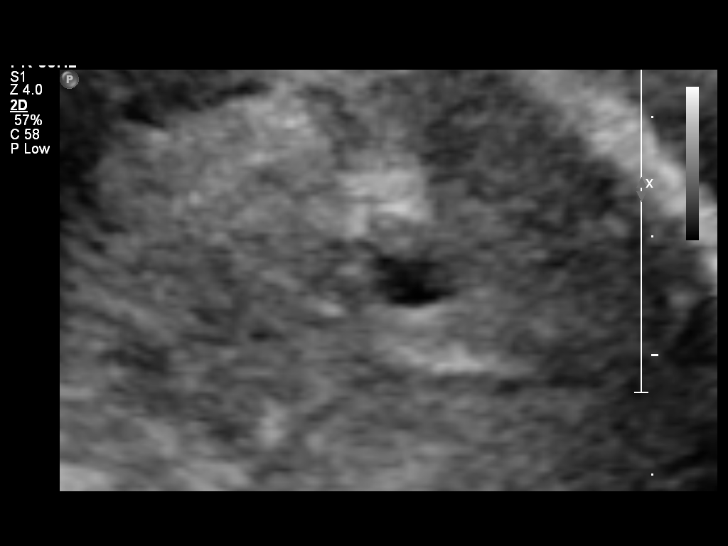
[im 17/24]
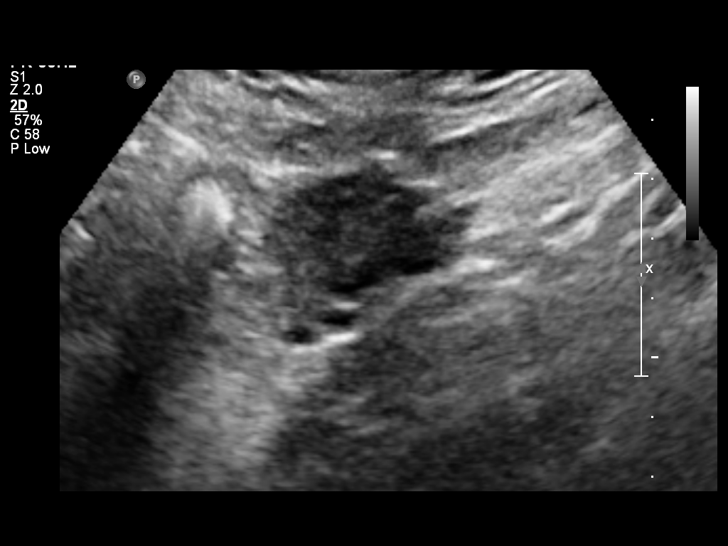
[im 19/24]
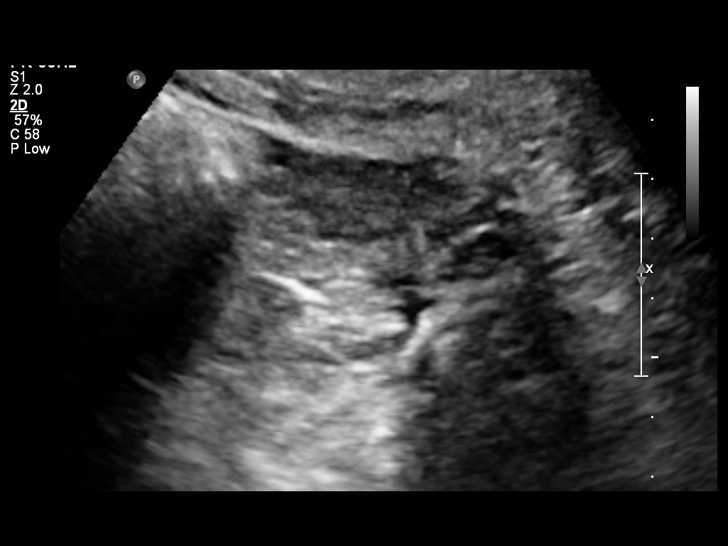
[im 20/24]
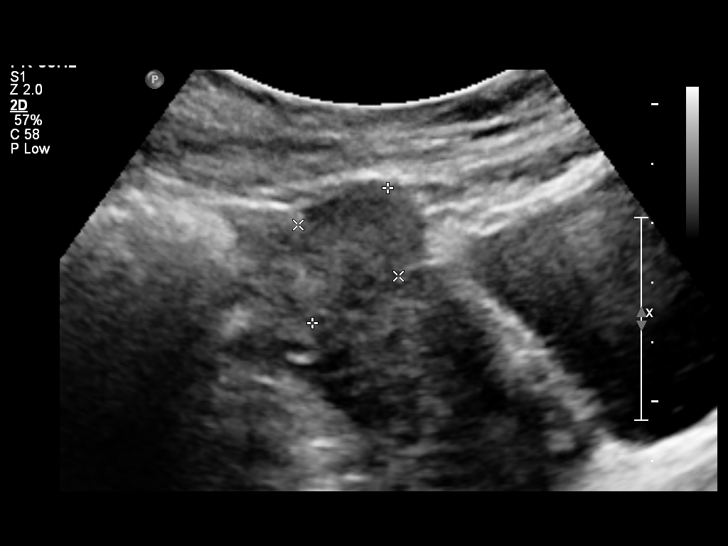
[im 22/24]
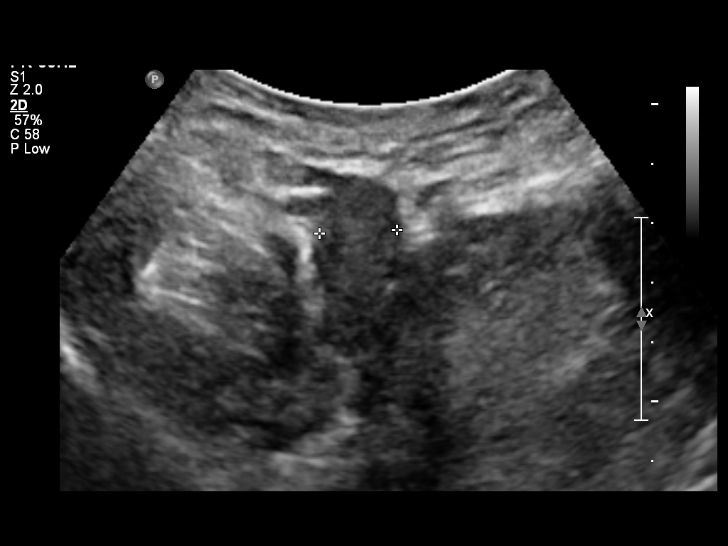
[im 24/24]
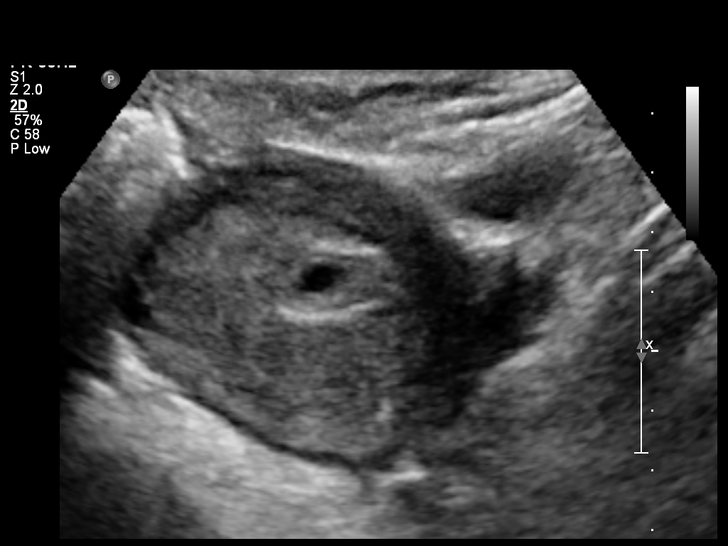

[14 of 24 positions shown; findings below may reference images not displayed]

Intrauterine gestational sac: Visualized/normal in shape.
Yolk sac: Present
Embryo: Not visualized

MSD: 6.2 mm, corresponding to an estimated gestational age of 5
weeks 1 day
US EDC: 12/19/2011

Maternal uterus/Adnexae:
No subchorionic hemorrhage.

Normal left ovary, measuring 1.5 x 2.9 x 2.2 cm.

Normal right ovary, measuring 1.9 x 2.6 x 1.3 cm.

No free fluid.
IMPRESSION: Early intrauterine gestation with estimated gestational age 5 weeks
1 day by mean sac diameter.

## 2012-03-06 ENCOUNTER — Encounter (HOSPITAL_COMMUNITY): Payer: Self-pay | Admitting: *Deleted

## 2012-03-06 ENCOUNTER — Emergency Department (HOSPITAL_COMMUNITY)
Admission: EM | Admit: 2012-03-06 | Discharge: 2012-03-06 | Disposition: A | Discharge status: Home / Self Care | Payer: Self-pay | Attending: Emergency Medicine | Admitting: Emergency Medicine

## 2012-03-06 DIAGNOSIS — Y92009 Unspecified place in unspecified non-institutional (private) residence as the place of occurrence of the external cause: Secondary | ICD-10-CM | POA: Insufficient documentation

## 2012-03-06 DIAGNOSIS — I82409 Acute embolism and thrombosis of unspecified deep veins of unspecified lower extremity: Secondary | ICD-10-CM | POA: Insufficient documentation

## 2012-03-06 DIAGNOSIS — Z862 Personal history of diseases of the blood and blood-forming organs and certain disorders involving the immune mechanism: Secondary | ICD-10-CM | POA: Insufficient documentation

## 2012-03-06 DIAGNOSIS — T24099A Burn of unspecified degree of multiple sites of unspecified lower limb, except ankle and foot, initial encounter: Secondary | ICD-10-CM | POA: Insufficient documentation

## 2012-03-06 DIAGNOSIS — Y93G3 Activity, cooking and baking: Secondary | ICD-10-CM | POA: Insufficient documentation

## 2012-03-06 DIAGNOSIS — X12XXXA Contact with other hot fluids, initial encounter: Secondary | ICD-10-CM | POA: Insufficient documentation

## 2012-03-06 DIAGNOSIS — T300 Burn of unspecified body region, unspecified degree: Secondary | ICD-10-CM

## 2012-03-06 MED ORDER — HYDROCODONE-ACETAMINOPHEN 5-500 MG PO TABS: 1.0000 | ORAL_TABLET | Freq: Four times a day (QID) | ORAL | Status: DC | PRN

## 2012-03-06 MED ORDER — SILVER SULFADIAZINE 1 % EX CREA
TOPICAL_CREAM | Freq: Once | CUTANEOUS | Status: AC
  Administered 2012-03-06: 14:00:00 via TOPICAL
  Filled 2012-03-06: qty 50

## 2012-03-06 MED ORDER — IBUPROFEN 800 MG PO TABS
800.0000 mg | ORAL_TABLET | Freq: Once | ORAL | Status: AC
  Administered 2012-03-06: 800 mg via ORAL
  Filled 2012-03-06: qty 1
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
## 2012-03-09 NOTE — ED Provider Notes (Signed)
Medical screening examination/treatment/procedure(s) were performed by non-physician practitioner and as supervising physician I was immediately available for consultation/collaboration.   Laray Anger, DO 03/09/12 2005

## 2012-03-10 ENCOUNTER — Emergency Department (HOSPITAL_COMMUNITY)
Admission: EM | Admit: 2012-03-10 | Discharge: 2012-03-10 | Disposition: A | Discharge status: Home / Self Care | Payer: Self-pay | Attending: Emergency Medicine | Admitting: Emergency Medicine

## 2012-03-10 ENCOUNTER — Encounter (HOSPITAL_COMMUNITY): Payer: Self-pay | Admitting: Emergency Medicine

## 2012-03-10 DIAGNOSIS — Y939 Activity, unspecified: Secondary | ICD-10-CM | POA: Insufficient documentation

## 2012-03-10 DIAGNOSIS — Y929 Unspecified place or not applicable: Secondary | ICD-10-CM | POA: Insufficient documentation

## 2012-03-10 DIAGNOSIS — Z862 Personal history of diseases of the blood and blood-forming organs and certain disorders involving the immune mechanism: Secondary | ICD-10-CM | POA: Insufficient documentation

## 2012-03-10 DIAGNOSIS — X58XXXA Exposure to other specified factors, initial encounter: Secondary | ICD-10-CM | POA: Insufficient documentation

## 2012-03-10 DIAGNOSIS — T300 Burn of unspecified body region, unspecified degree: Secondary | ICD-10-CM | POA: Insufficient documentation

## 2012-03-10 DIAGNOSIS — Z86718 Personal history of other venous thrombosis and embolism: Secondary | ICD-10-CM | POA: Insufficient documentation

## 2012-03-10 DIAGNOSIS — Z8742 Personal history of other diseases of the female genital tract: Secondary | ICD-10-CM | POA: Insufficient documentation

## 2012-03-10 MED ORDER — AMOXICILLIN-POT CLAVULANATE 875-125 MG PO TABS: 1.0000 | ORAL_TABLET | Freq: Two times a day (BID) | ORAL | Status: DC
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
## 2012-03-14 NOTE — ED Provider Notes (Signed)
Medical screening examination/treatment/procedure(s) were performed by non-physician practitioner and as supervising physician I was immediately available for consultation/collaboration.  Chelbi Herber T Andrews Tener, MD 03/14/12 2315 

## 2012-06-05 ENCOUNTER — Ambulatory Visit: Payer: Medicaid Other | Admitting: Internal Medicine

## 2013-01-03 ENCOUNTER — Other Ambulatory Visit (HOSPITAL_COMMUNITY): Payer: Self-pay | Admitting: Internal Medicine

## 2013-01-03 ENCOUNTER — Ambulatory Visit (HOSPITAL_COMMUNITY)
Admission: RE | Admit: 2013-01-03 | Discharge: 2013-01-03 | Disposition: A | Discharge status: Home / Self Care | Payer: 59 | Source: Ambulatory Visit | Attending: Internal Medicine | Admitting: Internal Medicine

## 2013-01-03 ENCOUNTER — Encounter (HOSPITAL_COMMUNITY): Payer: Self-pay

## 2013-01-03 DIAGNOSIS — K8020 Calculus of gallbladder without cholecystitis without obstruction: Secondary | ICD-10-CM | POA: Insufficient documentation

## 2013-01-03 DIAGNOSIS — M7989 Other specified soft tissue disorders: Secondary | ICD-10-CM

## 2013-01-03 DIAGNOSIS — I825Z9 Chronic embolism and thrombosis of unspecified deep veins of unspecified distal lower extremity: Secondary | ICD-10-CM | POA: Insufficient documentation

## 2013-01-03 DIAGNOSIS — R0600 Dyspnea, unspecified: Secondary | ICD-10-CM

## 2013-01-03 DIAGNOSIS — M25562 Pain in left knee: Secondary | ICD-10-CM

## 2013-01-03 DIAGNOSIS — R079 Chest pain, unspecified: Secondary | ICD-10-CM | POA: Insufficient documentation

## 2013-01-03 DIAGNOSIS — I82409 Acute embolism and thrombosis of unspecified deep veins of unspecified lower extremity: Secondary | ICD-10-CM

## 2013-01-03 DIAGNOSIS — R0602 Shortness of breath: Secondary | ICD-10-CM | POA: Insufficient documentation

## 2013-01-03 MED ORDER — IOHEXOL 350 MG/ML SOLN
80.0000 mL | Freq: Once | INTRAVENOUS | Status: AC | PRN
  Administered 2013-01-03: 80 mL via INTRAVENOUS

## 2013-01-30 ENCOUNTER — Telehealth: Payer: Self-pay | Admitting: Oncology

## 2013-01-30 NOTE — Telephone Encounter (Signed)
S/W PT IN REF TO NP APPT. ON 02/26/13@10 :30 REFERRING DR POLITE  DX RECURRENT DVT MAILED NP PACKET

## 2013-01-30 NOTE — Telephone Encounter (Signed)
C/D 01/30/13 for appt. 02/26/13

## 2013-02-21 ENCOUNTER — Other Ambulatory Visit: Payer: Self-pay | Admitting: Oncology

## 2013-02-21 DIAGNOSIS — D6859 Other primary thrombophilia: Secondary | ICD-10-CM

## 2013-02-26 ENCOUNTER — Ambulatory Visit: Payer: 59

## 2013-02-26 ENCOUNTER — Encounter: Payer: Self-pay | Admitting: Oncology

## 2013-02-26 ENCOUNTER — Ambulatory Visit (HOSPITAL_BASED_OUTPATIENT_CLINIC_OR_DEPARTMENT_OTHER): Payer: 59 | Admitting: Oncology

## 2013-02-26 ENCOUNTER — Telehealth: Payer: Self-pay | Admitting: Oncology

## 2013-02-26 ENCOUNTER — Other Ambulatory Visit (HOSPITAL_BASED_OUTPATIENT_CLINIC_OR_DEPARTMENT_OTHER): Payer: 59 | Admitting: Lab

## 2013-02-26 VITALS — BP 118/73 | HR 78 | Temp 96.8°F | Resp 18 | Ht 64.0 in | Wt 139.2 lb

## 2013-02-26 DIAGNOSIS — Z86718 Personal history of other venous thrombosis and embolism: Secondary | ICD-10-CM

## 2013-02-26 DIAGNOSIS — Z7901 Long term (current) use of anticoagulants: Secondary | ICD-10-CM

## 2013-02-26 DIAGNOSIS — D6859 Other primary thrombophilia: Secondary | ICD-10-CM

## 2013-02-26 DIAGNOSIS — I82409 Acute embolism and thrombosis of unspecified deep veins of unspecified lower extremity: Secondary | ICD-10-CM

## 2013-02-26 NOTE — Progress Notes (Signed)
Checked in new patient. No financial issue and she has not been out of country and she has appt card.

## 2013-02-26 NOTE — Progress Notes (Signed)
Please see consult note.  

## 2013-02-26 NOTE — Telephone Encounter (Signed)
gv and printed appt sched and avs for pt for DEC °
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
## 2013-02-28 LAB — HYPERCOAGULABLE PANEL, COMPREHENSIVE
AntiThromb III Func: 121 % (ref 76–126)
Anticardiolipin IgA: 7 APL U/mL (ref <22)
Anticardiolipin IgG: 17 GPL U/mL (ref <23)
Anticardiolipin IgM: 6 MPL U/mL (ref <11)
Beta-2 Glyco I IgG: 9 G Units (ref <20)
Beta-2-Glycoprotein I IgM: 4 M Units (ref <20)
DRVVT 1:1 Mix: 43.8 secs — ABNORMAL HIGH (ref <42.9)
DRVVT: 63.1 secs — ABNORMAL HIGH (ref <42.9)
PTT Lupus Anticoagulant: 41.7 secs (ref 28.0–43.0)
Protein S Activity: 77 % (ref 69–129)
Protein S Total: 69 % (ref 60–150)

## 2013-04-10 ENCOUNTER — Telehealth: Payer: Self-pay | Admitting: Oncology

## 2013-04-10 ENCOUNTER — Ambulatory Visit (HOSPITAL_BASED_OUTPATIENT_CLINIC_OR_DEPARTMENT_OTHER): Payer: 59 | Admitting: Oncology

## 2013-04-10 VITALS — BP 121/80 | HR 84 | Temp 97.7°F | Resp 18 | Ht 64.0 in | Wt 137.4 lb

## 2013-04-10 DIAGNOSIS — I82409 Acute embolism and thrombosis of unspecified deep veins of unspecified lower extremity: Secondary | ICD-10-CM

## 2013-04-10 DIAGNOSIS — Z7901 Long term (current) use of anticoagulants: Secondary | ICD-10-CM

## 2013-04-10 NOTE — Progress Notes (Signed)
Hematology and Oncology Follow Up Visit  Tiffany [name] [id number] [birth year] 27 y.o. 04/10/2013 9:40 AM Renford Dills D, MDPolite, Deirdre Peer, MD   Principle Diagnosis: 31 year old woman with history of multiple episodes of lower extremity deep vein thrombosis her first episode was in 2007 coinciding with her pregnancy state. Her second episode was in 2013 associated with pregnancy as well although it was unclear whether she had a chronic blood clot. Her most recent Dopplers in September of 2014 showed chronic deep vein thrombosis as well.   Current therapy: She is currently on Xarelto since September of 2014.  Interim History:  This is a pleasant 27 year old woman presents today for a followup visit. She has chronic deep vein thrombosis although the most recent episode is unclear to me whether was in July of 2013. She has been on Xarelto since September of 2014 without any complications. She has not reported any recent thrombosis or bleeding. And for the most part she has been doing very well. She does report some occasional fingers and toes being numb at times is still able to work and care for her children. She had not had any recent hospitalizations or illnesses.  Medications: I have reviewed the patient's current medications.  Current Outpatient Prescriptions  Medication Sig Dispense Refill  . Rivaroxaban (XARELTO) 20 MG TABS tablet Take 20 mg by mouth daily.       No current facility-administered medications for this visit.     Allergies: No Known Allergies  Past Medical History, Surgical history, Social history, and Family History were reviewed and updated.  Review of Systems:  Remaining ROS negative. Physical Exam: Blood pressure 121/80, pulse 84, temperature 97.7 F (36.5 C), temperature source Oral, resp. rate 18, height 5\' 4"  (1.626 m), weight 137 lb 6.4 oz (62.324 kg). ECOG: 0 General appearance: alert and cooperative Head: Normocephalic, without obvious abnormality,  atraumatic Neck: no adenopathy, no carotid bruit, no JVD, supple, symmetrical, trachea midline and thyroid not enlarged, symmetric, no tenderness/mass/nodules Lymph nodes: Cervical, supraclavicular, and axillary nodes normal. Heart:regular rate and rhythm, S1, S2 normal, no murmur, click, rub or gallop Lung:chest clear, no wheezing, rales, normal symmetric air entry Abdomin: soft, non-tender, without masses or organomegaly EXT:no erythema, induration, or nodules   Lab Results: Lab Results  Component Value Date   WBC 18.9* 11/16/2011   HGB 10.4* 11/16/2011   HCT 31.4* 11/16/2011   MCV 88.0 11/16/2011   PLT 305 11/16/2011     Chemistry      Component Value Date/Time   NA 132* 11/20/2010 1720   K 4.4 11/20/2010 1720   CL 100 11/20/2010 1720   CO2 23 11/20/2010 1720   BUN 5* 11/20/2010 1720   CREATININE 0.60 11/20/2010 1720      Component Value Date/Time   CALCIUM 9.4 11/20/2010 1720   ALKPHOS 119* 11/20/2010 1720   AST 32 11/20/2010 1720   ALT 20 11/20/2010 1720   BILITOT 0.2* 11/20/2010 1720      Impression and Plan:  27 year old woman with a history of a deep vein thrombosis potentially with multiple episodes involving the left lower extremity. Her first episode was in 2007 after giving birth for her second child. Since that time, she in treated with Lovenox during 2 other subsequent pregnancies although is unclear whether she had any acute deep vein thrombosis after that. It is very possible that her second episode in 2013 due to chronic deep vein thrombosis rather than acute episode. Her hypercoagulable workup was discussed today and shows no  evidence of any inherited thrombophilia. It is unclear whether her previous blood clots were provoked or not. For the time being I've advised her to continue on his Xarelto for at least a year and then we'll reassess whether she needs to continue on it lifetime or not at that time. We will schedule a followup in August of 2015.   [facility],  MD 12/11/20149:40 AM

## 2013-04-10 NOTE — Telephone Encounter (Signed)
Gave pt appt for MD in august 2015

## 2013-06-02 ENCOUNTER — Encounter (HOSPITAL_COMMUNITY): Payer: Self-pay | Admitting: Emergency Medicine

## 2013-06-02 ENCOUNTER — Emergency Department (HOSPITAL_COMMUNITY)
Admission: EM | Admit: 2013-06-02 | Discharge: 2013-06-02 | Disposition: A | Discharge status: Home / Self Care | Payer: 59 | Attending: Emergency Medicine | Admitting: Emergency Medicine

## 2013-06-02 DIAGNOSIS — R609 Edema, unspecified: Secondary | ICD-10-CM | POA: Insufficient documentation

## 2013-06-02 DIAGNOSIS — D6859 Other primary thrombophilia: Secondary | ICD-10-CM | POA: Insufficient documentation

## 2013-06-02 DIAGNOSIS — R0602 Shortness of breath: Secondary | ICD-10-CM | POA: Insufficient documentation

## 2013-06-02 DIAGNOSIS — R0600 Dyspnea, unspecified: Secondary | ICD-10-CM

## 2013-06-02 DIAGNOSIS — Z86718 Personal history of other venous thrombosis and embolism: Secondary | ICD-10-CM | POA: Insufficient documentation

## 2013-06-02 DIAGNOSIS — M7040 Prepatellar bursitis, unspecified knee: Secondary | ICD-10-CM

## 2013-06-02 DIAGNOSIS — M25569 Pain in unspecified knee: Secondary | ICD-10-CM | POA: Insufficient documentation

## 2013-06-02 DIAGNOSIS — Z7901 Long term (current) use of anticoagulants: Secondary | ICD-10-CM | POA: Insufficient documentation

## 2013-06-02 MED ORDER — PREDNISONE 10 MG PO TABS: 20.0000 mg | ORAL_TABLET | Freq: Two times a day (BID) | ORAL | Status: DC

## 2013-06-02 NOTE — ED Notes (Signed)
Registration at bedside.

## 2013-06-02 NOTE — ED Provider Notes (Signed)
CSN: [id number]     Arrival date & time 06/02/13  0831 History   First MD Initiated Contact with Patient 06/02/13 [phone]     Chief Complaint  Patient presents with  . Shortness of Breath  . Knee Pain    HX OF MULTIPLE DVT'S   (Consider location/radiation/quality/duration/timing/severity/associated sxs/prior Treatment) HPI Comments: Patient is a 28 year old female with history of recurrent DVT followed by Dr.Shadad from Hematology. She has been on xarelto since September. She presents today with complaints of swelling in her left knee in the absence of any injury or trauma. She denies calf pain or swelling in the ankle. She states that last night while she was propping her feet up she had an episode of shortness of breath. This seemed to resolve with her inhaler.  Patient is a 28 y.o. female presenting with shortness of breath. The history is provided by the patient.  Shortness of Breath Severity:  Mild Onset quality:  Sudden Timing:  Intermittent Progression:  Resolved Chronicity:  New Relieved by:  Nothing Worsened by:  Nothing tried Ineffective treatments:  None tried   Past Medical History  Diagnosis Date  . 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 Missed abortion 1/10    SP D&C  . SAB (spontaneous abortion) 1/09  . Anemia 2007    HGB 11.04 Feb 2009.  Marland Kitchen. Deep vein thrombosis (DVT)     left leg  . Preterm labor    Past Surgical History  Procedure Laterality Date  . Cesarean section  [id number]  . Dilation and curettage of uterus  1/10    For missed AB  . Cesarean section  11/20/2010    Procedure: CESAREAN SECTION;  Surgeon: Brock Badharles A Harper, MD;  Location: [facility];  Service: Gynecology;  Laterality: N/A;  repeat cesarean section   . Tonsillectomy    . Cesarean section  11/15/2011    Procedure: CESAREAN SECTION;  Surgeon: Kathreen CosierBernard A Marshall, MD;  Location: [facility];  Service: Gynecology;  Laterality: N/A;   Family History  Problem Relation Age of Onset  .  Hyperlipidemia Mother   . Hypertension Mother   . Hypertension Father   . Other Neg Hx    History  Substance Use Topics  . Smoking status: Never Smoker   . Smokeless tobacco: Never Used  . Alcohol Use: No   OB History   Grav Para Term Preterm Abortions TAB SAB Ect Mult Living   8 4 2 2 4  4   4      Review of Systems  Respiratory: Positive for shortness of breath.   All other systems reviewed and are negative.    Allergies  Review of patient's allergies indicates no known allergies.  Home Medications   Current Outpatient Rx  Name  Route  Sig  Dispense  Refill  . Rivaroxaban (XARELTO) 20 MG TABS tablet   Oral   Take 20 mg by mouth daily after lunch.           BP 135/85  Pulse 100  Temp(Src) 97.1 F (36.2 C) (Oral)  SpO2 100%  LMP 05/30/2013 Physical Exam  Nursing note and vitals reviewed. Constitutional: She is oriented to person, place, and time. She appears well-developed and well-nourished. No distress.  HENT:  Head: Normocephalic and atraumatic.  Neck: Normal range of motion. Neck supple.  Cardiovascular: Normal rate and regular rhythm.  Exam reveals no gallop and no friction rub.   No murmur heard. Pulmonary/Chest: Effort normal and breath sounds  normal. No respiratory distress. She has no wheezes.  Abdominal: Soft. Bowel sounds are normal. She exhibits no distension. There is no tenderness.  Musculoskeletal: Normal range of motion.  There is swelling and tenderness to palpation to the anterior aspect of the left knee. There is no joint effusion, no redness, and the extremity appears otherwise normal. There is no calf tenderness and no palpable cords. Distal pulses are easily palpable.  Neurological: She is alert and oriented to person, place, and time.  Skin: Skin is warm and dry. She is not diaphoretic.    ED Course  Procedures (including critical care time) Labs Review Labs Reviewed - No data to display Imaging Review No results found.    MDM   No diagnosis found. Patient is a 28 year old female with history of DVT for which she is on Xarelto. She presents today with left knee pain which appears clinically to be prepatellar bursitis. There is no calf pain and no distal swelling to suggest recurrent DVT. She also reported an episode of shortness of breath last night which seemed to resolve with her inhaler. She is currently not having any dyspnea or chest discomfort. Her oxygen saturations are 100% and her heart rate is 70. I have very low suspicion of pulmonary embolus.  I discussed the issue with Dr. Arbutus Ped from hematology. We are in agreement that an ultrasound of the lower extremity does not change the management of the patient. We've discussed the possibility of a CT angiogram to rule out pulmonary embolus, however given the clinical situation I feel that her probability of this is very low. She will be discharged to home and instructed to followup with Dr. Clelia Croft in the next 2 weeks. She understands to return if her symptoms worsen or change.    Geoffery Lyons, MD 06/02/13 [phone]

## 2013-06-02 NOTE — Discharge Instructions (Signed)
Return to the emergency department if you develop chest pain, difficulty breathing, or increased pain or swelling in your calf.   Bursitis Bursitis is a swelling and soreness (inflammation) of a fluid-filled sac (bursa) that overlies and protects a joint. It can be caused by injury, overuse of the joint, arthritis or infection. The joints most likely to be affected are the elbows, shoulders, hips and knees. HOME CARE INSTRUCTIONS   Apply ice to the affected area for 15-20 minutes each hour while awake for 2 days. Put the ice in a plastic bag and place a towel between the bag of ice and your skin.  Rest the injured joint as much as possible, but continue to put the joint through a full range of motion, 4 times per day. (The shoulder joint especially becomes rapidly "frozen" if not used.) When the pain lessens, begin normal slow movements and usual activities.  Only take over-the-counter or prescription medicines for pain, discomfort or fever as directed by your caregiver.  Your caregiver may recommend draining the bursa and injecting medicine into the bursa. This may help the healing process.  Follow all instructions for follow-up with your caregiver. This includes any orthopedic referrals, physical therapy and rehabilitation. Any delay in obtaining necessary care could result in a delay or failure of the bursitis to heal and chronic pain. SEEK IMMEDIATE MEDICAL CARE IF:   Your pain increases even during treatment.  You develop an oral temperature above 102 F (38.9 C) and have heat and inflammation over the involved bursa. MAKE SURE YOU:   Understand these instructions.  Will watch your condition.  Will get help right away if you are not doing well or get worse. Document Released: 04/14/2000 Document Revised: 07/10/2011 Document Reviewed: 03/19/2009 [facility] Patient Information 2014 [place], [place].

## 2013-06-02 NOTE — ED Notes (Signed)
Pharmacy tech at bedside 

## 2013-06-02 NOTE — ED Notes (Signed)
Sudden on SOB last pm then started with left knee pain. Hx of multiple DVT's. Takes Xarelto. PCP Dr. Nehemiah SettlePolite. No distress at this time.

## 2013-06-02 NOTE — ED Notes (Addendum)
Pt c/o left knee pain, reports last night she noticed some swelling that started to anterior lower knee then she started to have some pain. Pt also c/o sob that started last night. Denies recent cough. Has been having some nasal drainage and congestion. Pt also c/o blister underneath tongue. Nad, skin warm and dry, resp e/u.

## 2013-10-06 ENCOUNTER — Other Ambulatory Visit: Payer: Self-pay | Admitting: Obstetrics

## 2013-10-09 ENCOUNTER — Encounter (HOSPITAL_COMMUNITY): Payer: Self-pay | Admitting: Pharmacist

## 2013-10-10 NOTE — H&P (Signed)
NAMJannetta Quint:  Dearmas, Tenasia            ACCOUNT NO.:  [id number]  MEDICAL RECORD NO.:  [id number]  LOCATION:  A03C                         FACILITY:  [facility]  PHYSICIAN:  Kathreen CosierBernard A. Nohely Whitehorn, M.D.DATE OF BIRTH:  01-Feb-1986  DATE OF ADMISSION:  06/02/2013 DATE OF DISCHARGE:  06/02/2013                             HISTORY & PHYSICAL   Date of operation October 15, 2013.  HISTORY OF PRESENT ILLNESS:  The patient is a 28 year old, gravida 4, para 4-0-0-4, who is in for a hysteroscopy, D and C, because of abnormal uterine bleeding.  The patient had an endometrial biopsy performed, which showed endometrial polyps and no hyperplasia.  PAST MEDICAL HISTORY:  The patient is on Xarelto 20 mg daily because of DVTs in the past.  Consult with Dr. Nehemiah SettlePolite, her internist has stopped the medication on Saturday prior to surgery.  PAST SURGICAL HISTORY:  She had a tubal ligation in the past.  SOCIAL HISTORY:  Negative.  SYSTEM REVIEW:  Negative.  PHYSICAL EXAMINATION:  GENERAL:  Well-developed female in no distress. HEENT:  Negative. LUNGS:  Clear to P and A. HEART:  Regular rhythm.  No murmurs, no gallops. BREASTS:  Negative. ABDOMEN:  Negative. GU:  Pelvic and uterus normal size, negative adnexa, external genitalia normal.  EXTREMITIES:  Negative.          ______________________________ Kathreen CosierBernard A. Joseph Johns, M.D.     BAM/MEDQ  D:  10/10/2013  T:  10/10/2013  Job:  [id number]

## 2013-10-10 NOTE — H&P (Deleted)
NAME:  Tiffany [name], Tiffany [name]            ACCOUNT NO.:  [id number]  MEDICAL RECORD NO.:  [id number]  LOCATION:  A03C                         FACILITY:  [facility]  PHYSICIAN:  Davon Abdelaziz A. Xochil Shanker, M.D.DATE OF BIRTH:  11/13/1985  DATE OF ADMISSION:  06/02/2013 DATE OF DISCHARGE:  06/02/2013                             HISTORY & PHYSICAL   Date of operation October 15, 2013.  HISTORY OF PRESENT ILLNESS:  The patient is a 27-year-old, gravida 4, para 4-0-0-4, who is in for a hysteroscopy, D and C, because of abnormal uterine bleeding.  The patient had an endometrial biopsy performed, which showed endometrial polyps and no hyperplasia.  PAST MEDICAL HISTORY:  The patient is on Xarelto 20 mg daily because of DVTs in the past.  Consult with Dr. Polite, her internist has stopped the medication on Saturday prior to surgery.  PAST SURGICAL HISTORY:  She had a tubal ligation in the past.  SOCIAL HISTORY:  Negative.  SYSTEM REVIEW:  Negative.  PHYSICAL EXAMINATION:  GENERAL:  Well-developed female in no distress. HEENT:  Negative. LUNGS:  Clear to P and A. HEART:  Regular rhythm.  No murmurs, no gallops. BREASTS:  Negative. ABDOMEN:  Negative. GU:  Pelvic and uterus normal size, negative adnexa, external genitalia normal.  EXTREMITIES:  Negative.          ______________________________ Tiffany [name], M.D.     BAM/MEDQ  D:  10/10/2013  T:  10/10/2013  Job:  [id number] 

## 2013-10-15 ENCOUNTER — Ambulatory Visit (HOSPITAL_COMMUNITY)
Admission: RE | Admit: 2013-10-15 | Discharge: 2013-10-15 | Disposition: A | Discharge status: Home / Self Care | Payer: 59 | Source: Ambulatory Visit | Attending: Obstetrics | Admitting: Obstetrics

## 2013-10-15 ENCOUNTER — Encounter (HOSPITAL_COMMUNITY): Payer: Self-pay | Admitting: *Deleted

## 2013-10-15 ENCOUNTER — Encounter (HOSPITAL_COMMUNITY)
Admission: RE | Disposition: A | Discharge status: Home / Self Care | Payer: Self-pay | Source: Ambulatory Visit | Attending: Obstetrics

## 2013-10-15 ENCOUNTER — Ambulatory Visit (HOSPITAL_COMMUNITY): Payer: 59 | Admitting: Anesthesiology

## 2013-10-15 ENCOUNTER — Encounter (HOSPITAL_COMMUNITY): Payer: 59 | Admitting: Anesthesiology

## 2013-10-15 DIAGNOSIS — Z86718 Personal history of other venous thrombosis and embolism: Secondary | ICD-10-CM | POA: Insufficient documentation

## 2013-10-15 DIAGNOSIS — J45909 Unspecified asthma, uncomplicated: Secondary | ICD-10-CM | POA: Insufficient documentation

## 2013-10-15 DIAGNOSIS — N938 Other specified abnormal uterine and vaginal bleeding: Secondary | ICD-10-CM | POA: Insufficient documentation

## 2013-10-15 DIAGNOSIS — N925 Other specified irregular menstruation: Secondary | ICD-10-CM | POA: Insufficient documentation

## 2013-10-15 DIAGNOSIS — N949 Unspecified condition associated with female genital organs and menstrual cycle: Secondary | ICD-10-CM | POA: Insufficient documentation

## 2013-10-15 HISTORY — DX: Umbilical hernia without obstruction or gangrene: K42.9

## 2013-10-15 HISTORY — PX: HYSTEROSCOPY W/D&C: SHX1775

## 2013-10-15 LAB — CBC
HEMATOCRIT: 36.9 % (ref 36.0–46.0)
Hemoglobin: 12.5 g/dL (ref 12.0–15.0)
MCH: 30 pg (ref 26.0–34.0)
MCHC: 33.9 g/dL (ref 30.0–36.0)
MCV: 88.5 fL (ref 78.0–100.0)
Platelets: 310 10*3/uL (ref 150–400)
RBC: 4.17 MIL/uL (ref 3.87–5.11)
RDW: 12.8 % (ref 11.5–15.5)
WBC: 7.4 10*3/uL (ref 4.0–10.5)

## 2013-10-15 LAB — APTT: aPTT: 28 seconds (ref 24–37)

## 2013-10-15 LAB — PROTIME-INR
INR: 1.23 (ref 0.00–1.49)
Prothrombin Time: 15.2 seconds (ref 11.6–15.2)

## 2013-10-15 SURGERY — DILATATION AND CURETTAGE /HYSTEROSCOPY
Anesthesia: General | Site: Uterus

## 2013-10-15 MED ORDER — KETOROLAC TROMETHAMINE 30 MG/ML IJ SOLN
INTRAMUSCULAR | Status: AC
  Filled 2013-10-15: qty 1

## 2013-10-15 MED ORDER — LIDOCAINE HCL 1 % IJ SOLN
INTRAMUSCULAR | Status: DC | PRN
  Administered 2013-10-15: 10 mL via INTRADERMAL

## 2013-10-15 MED ORDER — LIDOCAINE HCL (CARDIAC) 20 MG/ML IV SOLN
INTRAVENOUS | Status: DC | PRN
  Administered 2013-10-15: 50 mg via INTRAVENOUS
  Administered 2013-10-15: 30 mg via INTRAVENOUS

## 2013-10-15 MED ORDER — LIDOCAINE HCL (CARDIAC) 20 MG/ML IV SOLN
INTRAVENOUS | Status: AC
  Filled 2013-10-15: qty 5

## 2013-10-15 MED ORDER — LACTATED RINGERS IV SOLN
INTRAVENOUS | Status: DC
  Administered 2013-10-15 (×2): via INTRAVENOUS

## 2013-10-15 MED ORDER — FENTANYL CITRATE 0.05 MG/ML IJ SOLN: 25.0000 ug | INTRAMUSCULAR | Status: DC | PRN

## 2013-10-15 MED ORDER — KETOROLAC TROMETHAMINE 30 MG/ML IJ SOLN
INTRAMUSCULAR | Status: DC | PRN
  Administered 2013-10-15: 30 mg via INTRAVENOUS

## 2013-10-15 MED ORDER — ONDANSETRON HCL 4 MG/2ML IJ SOLN
INTRAMUSCULAR | Status: AC
  Filled 2013-10-15: qty 2

## 2013-10-15 MED ORDER — KETOROLAC TROMETHAMINE 30 MG/ML IJ SOLN: 15.0000 mg | Freq: Once | INTRAMUSCULAR | Status: DC | PRN

## 2013-10-15 MED ORDER — LACTATED RINGERS IV SOLN: INTRAVENOUS | Status: DC | PRN

## 2013-10-15 MED ORDER — SCOPOLAMINE 1 MG/3DAYS TD PT72
1.0000 | MEDICATED_PATCH | TRANSDERMAL | Status: DC
  Administered 2013-10-15: 1.5 mg via TRANSDERMAL

## 2013-10-15 MED ORDER — METOCLOPRAMIDE HCL 5 MG/ML IJ SOLN: 10.0000 mg | Freq: Once | INTRAMUSCULAR | Status: DC | PRN

## 2013-10-15 MED ORDER — FLUMAZENIL 0.5 MG/5ML IV SOLN
INTRAVENOUS | Status: DC | PRN
  Administered 2013-10-15: 0.2 mg via INTRAVENOUS

## 2013-10-15 MED ORDER — FLUMAZENIL 0.5 MG/5ML IV SOLN
INTRAVENOUS | Status: AC
  Filled 2013-10-15: qty 5

## 2013-10-15 MED ORDER — MEPERIDINE HCL 25 MG/ML IJ SOLN: 6.2500 mg | INTRAMUSCULAR | Status: DC | PRN

## 2013-10-15 MED ORDER — GLYCINE 1.5 % IR SOLN
Status: DC | PRN
  Administered 2013-10-15: 3000 mL

## 2013-10-15 MED ORDER — SCOPOLAMINE 1 MG/3DAYS TD PT72
MEDICATED_PATCH | TRANSDERMAL | Status: AC
  Administered 2013-10-15: 1.5 mg via TRANSDERMAL
  Filled 2013-10-15: qty 1

## 2013-10-15 MED ORDER — PROPOFOL 10 MG/ML IV BOLUS
INTRAVENOUS | Status: DC | PRN
  Administered 2013-10-15: 150 mg via INTRAVENOUS

## 2013-10-15 MED ORDER — FENTANYL CITRATE 0.05 MG/ML IJ SOLN
INTRAMUSCULAR | Status: AC
  Filled 2013-10-15: qty 2

## 2013-10-15 MED ORDER — LIDOCAINE HCL 1 % IJ SOLN
INTRAMUSCULAR | Status: AC
  Filled 2013-10-15: qty 20

## 2013-10-15 MED ORDER — MIDAZOLAM HCL 2 MG/2ML IJ SOLN
INTRAMUSCULAR | Status: DC | PRN
  Administered 2013-10-15: 2 mg via INTRAVENOUS

## 2013-10-15 MED ORDER — SCOPOLAMINE 1 MG/3DAYS TD PT72: 1.0000 | MEDICATED_PATCH | Freq: Once | TRANSDERMAL | Status: DC | PRN

## 2013-10-15 MED ORDER — ONDANSETRON HCL 4 MG/2ML IJ SOLN
INTRAMUSCULAR | Status: DC | PRN
  Administered 2013-10-15: 4 mg via INTRAVENOUS

## 2013-10-15 MED ORDER — FENTANYL CITRATE 0.05 MG/ML IJ SOLN
INTRAMUSCULAR | Status: DC | PRN
  Administered 2013-10-15 (×2): 50 ug via INTRAVENOUS

## 2013-10-15 MED ORDER — PROPOFOL 10 MG/ML IV EMUL
INTRAVENOUS | Status: AC
  Filled 2013-10-15: qty 20

## 2013-10-15 MED ORDER — MIDAZOLAM HCL 2 MG/2ML IJ SOLN
INTRAMUSCULAR | Status: AC
  Filled 2013-10-15: qty 2

## 2013-10-15 SURGICAL SUPPLY — 18 items
CANISTER SUCT 3000ML (MISCELLANEOUS) ×2 IMPLANT
CATH ROBINSON RED A/P 16FR (CATHETERS) ×2 IMPLANT
CLOTH BEACON ORANGE TIMEOUT ST (SAFETY) ×2 IMPLANT
CONTAINER PREFILL 10% NBF 60ML (FORM) ×4 IMPLANT
DRAPE HYSTEROSCOPY (DRAPE) ×2 IMPLANT
DRSG TELFA 3X8 NADH (GAUZE/BANDAGES/DRESSINGS) ×2 IMPLANT
ELECT REM PT RETURN 9FT ADLT (ELECTROSURGICAL)
ELECTRODE REM PT RTRN 9FT ADLT (ELECTROSURGICAL) IMPLANT
GLOVE BIO SURGEON STRL SZ8.5 (GLOVE) ×2 IMPLANT
GOWN STRL REUS W/TWL 2XL LVL3 (GOWN DISPOSABLE) ×2 IMPLANT
GOWN STRL REUS W/TWL LRG LVL3 (GOWN DISPOSABLE) ×2 IMPLANT
LOOP ANGLED CUTTING 22FR (CUTTING LOOP) IMPLANT
PACK VAGINAL MINOR WOMEN LF (CUSTOM PROCEDURE TRAY) ×2 IMPLANT
PAD OB MATERNITY 4.3X12.25 (PERSONAL CARE ITEMS) ×2 IMPLANT
SET TUBING HYSTEROSCOPY 2 NDL (TUBING) ×2 IMPLANT
TOWEL OR 17X24 6PK STRL BLUE (TOWEL DISPOSABLE) ×4 IMPLANT
TUBE HYSTEROSCOPY W Y-CONNECT (TUBING) ×2 IMPLANT
WATER STERILE IRR 1000ML POUR (IV SOLUTION) ×2 IMPLANT

## 2013-10-15 NOTE — Op Note (Signed)
Preop diagnosis DOB Postop diagnosis the same Procedure hysteroscopy D&C Anesthesia Gen. Surgeon Dr. Francoise CeoBernard Marshall under  general anesthesia patient in the lithotomy position perineum and vagina prepped and draped bladder emptied with a straight catheter speculum placed in the vagina cervix injected with 10 cc of 1% Xylocaine anterior lip of the cervix grasped with tenaculum endometrial cavity sounded to the cavity was posterior cervix dilated to #25 Pratt hysteroscope inserted and the cavity was normal except for one small polyp hysteroscope removed and a sharp curettage performed a him to the cavity was clean patient tolerated the procedure well and

## 2013-10-15 NOTE — Transfer of Care (Signed)
Immediate Anesthesia Transfer of Care Note  Patient: Tiffany [name]  Procedure(s) Performed: Procedure(s): HYSTEROSCOPY/DILATATION AND CURETTAGE (N/A)  Patient Location: PACU  Anesthesia Type:General  Level of Consciousness: awake, sedated and patient cooperative  Airway & Oxygen Therapy: Patient Spontanous Breathing and Patient connected to nasal cannula oxygen  Post-op Assessment: Report given to PACU RN and Post -op Vital signs reviewed and stable  Post vital signs: Reviewed and stable  Complications: No apparent anesthesia complications

## 2013-10-15 NOTE — Discharge Instructions (Signed)
D&C Hysterosocpy °Care After °Read the instructions below. Refer to this sheet in the next few weeks. These instructions provide you with general information on caring for yourself after you leave the hospital. Your caregiver may also give you specific instructions.  °D&C or vacuum curettage is a minor operation. A D&C involves the stretching (dilatation) of the cervix and scraping (curettage) of the inside lining of the uterus. A vacuum curettage gently sucks out the lining and tissue in the uterus with a tube. You may have light cramping and bleeding for a couple of days to two weeks after the procedure. This procedure may be done in a hospital, outpatient clinic, or doctor's office. You may be given a drug to make you sleep (general anesthetic) or a drug that numbs the area (local anesthetic) in and around the cervix. °HOME CARE INSTRUCTIONS °· Do not drive for 24 hours.  °· Wait one week before returning to strenuous activities.  °· Take your temperature two times a day for 4 days and write it down. Provide these temperatures to your caregiver if they are abnormal (above 98.6° F or 37.0° C).  °· Avoid long periods of standing, and do no heavy lifting (more than 10 pounds), pushing or pulling.  °· Limit stair climbing to once or twice a day.  °· Take rest periods often.  °· You may resume your usual diet.  °· Drink plenty of fluids (6-8 glasses a day).  °· You should return to your usual bowel function. If constipation should occur, you may:  °· Take a mild laxative with permission from your caregiver.  °· Add fruit and bran to your diet.  °· Drink more fluids. This helps with constipation.  °· Take showers instead of baths until your caregiver gives you permission to take baths.  °· Do not go swimming or use a hot tub until your caregiver gives you permission.  °· Try to have someone with you or available for you the first 24 to 48 hours, especially if you had a general anesthetic.  °· Do not douche, use  tampons, or have intercourse until after your follow-up appointment, or when your caregiver approves.  °· Only take over-the-counter or prescription medicines for pain, discomfort, or fever as directed by your caregiver. Do not take aspirin. It can cause bleeding.  °· If a prescription was given, follow your caregiver's directions. You may be given a medicine that kills germs (antibiotic) to prevent an infection.  °· Keep all your follow-up appointments recommended by your caregiver.  °SEEK MEDICAL CARE IF: °· You have increasing cramps or pain not relieved with medication.  °· You develop belly (abdominal) pain which does not seem to be related to the same area of earlier cramping and pain.  °· You feel dizzy or feel like fainting.  °· You have bad smelling vaginal discharge.  °· You develop a rash.  °· You develop a reaction or allergy to your medication.  °SEEK IMMEDIATE MEDICAL CARE IF: °· Bleeding is heavier than a normal menstrual period.  °· You have an oral temperature above 100.6, not controlled by medicine.  °· You develop chest pain.  °· You develop shortness of breath.  °· You pass out.  °· You develop pain in your shoulder strap area.  °· You develop heavy vaginal bleeding with or without blood clots.  °MAKE SURE YOU:  °· Understand these instructions.  °· Will watch your condition.  °· Will get help right away if you are   not doing well or get worse.  °UPDATED HEALTH PRACTICES °· A PAP smear is done to screen for cervical cancer.  °· The first PAP smear should be done at age 21.  °· Between ages 21 and 29, PAP smears are repeated every 2 years.  °· Beginning at age 30, you are advised to have a PAP smear every 3 years as long as your past 3 PAP smears have been normal.  °· Some women have medical problems that increase the chance of getting cervical cancer. Talk to your caregiver about these problems. It is especially important to talk to your caregiver if a new problem develops soon after your last PAP  smear. In these cases, your caregiver may recommend more frequent screening and Pap smears.  °· The above recommendations are the same for women who have or have not gotten the vaccine for HPV (Human Papillomavirus).  °· If you had a hysterectomy for a problem that was not a cancer or a condition that could lead to cancer, then you no longer need Pap smears.  °· If you are between ages 65 and 70, and you have had normal Pap smears going back 10 years, you no longer need Pap smears.  °· If you have had past treatment for cervical cancer or a condition that could lead to cancer, you need Pap smears and screening for cancer for at least 20 years after your treatment.  °· Continue monthly self-breast examinations. Your caregiver can provide information and instructions for self-breast examination.  °Document Released: 04/14/2000 Document Re-Released: 10/05/2009 °ExitCare® Patient Information ©2011 Exit[employer], LLC. °

## 2013-10-15 NOTE — Anesthesia Preprocedure Evaluation (Addendum)
Anesthesia Evaluation  Patient identified by MRN, date of birth, ID band Patient awake    Reviewed: Allergy & Precautions, H&P , NPO status , Patient's Chart, lab work & pertinent test results, reviewed documented beta blocker date and time   History of Anesthesia Complications Negative for: history of anesthetic complications  Airway Mallampati: I TM Distance: >3 FB Neck ROM: full    Dental  (+) Teeth Intact   Pulmonary asthma (albuterol twice a week with exercise, last used 2 days ago) ,  breath sounds clear to auscultation  Pulmonary exam normal       Cardiovascular Exercise Tolerance: Good DVT (on Xarelto.  Last dose 10/11/13) Rhythm:regular Rate:Normal     Neuro/Psych negative neurological ROS  negative psych ROS   GI/Hepatic negative GI ROS, Neg liver ROS,   Endo/Other  Recent thyroid test abnormalities - further work-up needed  Renal/GU negative Renal ROS  Female GU complaint     Musculoskeletal   Abdominal Normal abdominal exam  (+)   Peds  Hematology negative hematology ROS (+)   Anesthesia Other Findings   Reproductive/Obstetrics negative OB ROS                          Anesthesia Physical Anesthesia Plan  ASA: II  Anesthesia Plan: General LMA   Post-op Pain Management:    Induction:   Airway Management Planned:   Additional Equipment:   Intra-op Plan:   Post-operative Plan:   Informed Consent: I have reviewed the patients History and Physical, chart, labs and discussed the procedure including the risks, benefits and alternatives for the proposed anesthesia with the patient or authorized representative who has indicated his/her understanding and acceptance.   Dental Advisory Given  Plan Discussed with: CRNA and Surgeon  Anesthesia Plan Comments:         Anesthesia Quick Evaluation

## 2013-10-15 NOTE — H&P (Signed)
There has been no change in her history and physical except  We stopped her xerelta  20 mg 3 days prior to surgery

## 2013-10-15 NOTE — Anesthesia Postprocedure Evaluation (Signed)
  Anesthesia Post-op Note  Anesthesia Post Note  Patient: Tiffany [name]  Procedure(s) Performed: Procedure(s) (LRB): HYSTEROSCOPY/DILATATION AND CURETTAGE (N/A)  Anesthesia type: General  Patient location: PACU  Post pain: Pain level controlled  Post assessment: Post-op Vital signs reviewed  Last Vitals:  Filed Vitals:   10/15/13 1340  BP: 110/80  Pulse: 61  Temp: 36.7 C  Resp: 14    Post vital signs: Reviewed  Level of consciousness: sedated  Complications: No apparent anesthesia complications

## 2013-10-16 ENCOUNTER — Encounter (HOSPITAL_COMMUNITY): Payer: Self-pay | Admitting: Obstetrics

## 2013-10-30 ENCOUNTER — Ambulatory Visit (HOSPITAL_COMMUNITY): Payer: 59 | Admitting: Psychiatry

## 2013-12-04 ENCOUNTER — Ambulatory Visit (HOSPITAL_COMMUNITY): Payer: 59 | Admitting: Psychiatry

## 2013-12-09 ENCOUNTER — Ambulatory Visit (HOSPITAL_BASED_OUTPATIENT_CLINIC_OR_DEPARTMENT_OTHER): Payer: 59 | Admitting: Oncology

## 2013-12-09 ENCOUNTER — Encounter: Payer: Self-pay | Admitting: Oncology

## 2013-12-09 ENCOUNTER — Telehealth: Payer: Self-pay | Admitting: Oncology

## 2013-12-09 VITALS — BP 114/71 | HR 63 | Temp 98.2°F | Resp 18 | Ht 65.0 in | Wt 139.7 lb

## 2013-12-09 DIAGNOSIS — D6859 Other primary thrombophilia: Secondary | ICD-10-CM

## 2013-12-09 DIAGNOSIS — Z7901 Long term (current) use of anticoagulants: Secondary | ICD-10-CM

## 2013-12-09 DIAGNOSIS — Z86718 Personal history of other venous thrombosis and embolism: Secondary | ICD-10-CM

## 2013-12-09 NOTE — Telephone Encounter (Signed)
Pt confirmed labs/ov per 08/11 POF, gave pt AVS...KJ °

## 2013-12-09 NOTE — Progress Notes (Signed)
Hematology and Oncology Follow Up Visit  Tiffany PingMyishie L [name] [id number] [birth year] 28 y.o. 12/09/2013 9:23 AM Renford DillsPOLITE,RONALD D, MDPolite, Deirdre Peeronald D, MD   Principle Diagnosis: 28 year old woman with history of multiple episodes of lower extremity deep vein thrombosis her first episode was in 2007 coinciding with her pregnancy state. Her second episode was in 2013 associated with pregnancy as well although it was unclear whether she had a chronic blood clot. Her most recent Dopplers in September of 2014 showed chronic deep vein thrombosis as well.   Current therapy: She is currently on Xarelto since September of 2014.  Interim History: Tiffany [name] presents today for a followup visit. She has chronic deep vein thrombosis although the most recent episode is unclear to me whether was in July of 2013. She has been on Xarelto since September of 2014 without any complications. Since her last visit, she had a D&C in June of 2015 without any complications. Since that time she did not have any vaginal bleeding. She has not reported any recent thrombosis or bleeding. And for the most part she has been doing very well. She does not report any headaches or blurry vision or syncope. Does not report any seizure or change in her mood. [place] any fevers or or chills. She does not report any chest pain orthopnea or PND. Sample of any cough or hemoptysis she does report occasional wheezing. She does not report any nausea or vomiting or abdominal pain. Does not report any frequency urgency or hesitancy. She does not report any vaginal bleeding or hematuria. Rest of her review of systems unremarkable.  Medications: I have reviewed the patient's current medications.  Current Outpatient Prescriptions  Medication Sig Dispense Refill  . albuterol (PROVENTIL HFA;VENTOLIN HFA) 108 (90 BASE) MCG/ACT inhaler Inhale 2 puffs into the lungs every 6 (six) hours as needed for wheezing or shortness of breath.      . gabapentin  (NEURONTIN) 100 MG capsule Take 100 mg by mouth daily.      . Rivaroxaban (XARELTO) 20 MG TABS tablet Take 20 mg by mouth daily after lunch.        No current facility-administered medications for this visit.     Allergies: No Known Allergies  Past Medical History, Surgical history, Social history, and Family History were reviewed and updated.  Review of Systems:  Remaining ROS negative. Physical Exam: Blood pressure 114/71, pulse 63, temperature 98.2 F (36.8 C), temperature source Oral, resp. rate 18, height 5\' 5"  (1.651 m), weight 139 lb 11.2 oz (63.368 kg). ECOG: 0 General appearance: alert and cooperative Head: Normocephalic, without obvious abnormality, atraumatic Neck: no adenopathy Lymph nodes: Cervical, supraclavicular, and axillary nodes normal. Heart:regular rate and rhythm, S1, S2 normal, no murmur, click, rub or gallop Lung:chest clear, no wheezing, rales, normal symmetric air entry Abdomin: soft, non-tender, without masses or organomegaly EXT:no erythema, induration, or nodules   Lab Results: Lab Results  Component Value Date   WBC 7.4 10/15/2013   HGB 12.5 10/15/2013   HCT 36.9 10/15/2013   MCV 88.5 10/15/2013   PLT 310 10/15/2013     Chemistry      Component Value Date/Time   NA 132* 11/20/2010 1720   K 4.4 11/20/2010 1720   CL 100 11/20/2010 1720   CO2 23 11/20/2010 1720   BUN 5* 11/20/2010 1720   CREATININE 0.60 11/20/2010 1720      Component Value Date/Time   CALCIUM 9.4 11/20/2010 1720   ALKPHOS 119* 11/20/2010 1720   AST 32 11/20/2010  1720   ALT 20 11/20/2010 1720   BILITOT 0.2* 11/20/2010 1720      Impression and Plan:  28 year old woman with a history of a deep vein thrombosis potentially with multiple episodes involving the left lower extremity. Her first episode was in 2007 after giving birth for her second child. Since that time, she in treated with Lovenox during 2 other subsequent pregnancies.   Her hypercoagulable workup was discussed again  today and shows no evidence of any inherited thrombophilia. It is unclear whether her previous blood clots were provoked or not.   Risks and benefits of continuing Xarelto was discussed today with the patient. Complications of long-term anticoagulation was discussed versus the risks of developing another blood clot. For the time being, she prefers to have some protection from any further blood clots and elected continue on Xarelto for the time being. I will assess her in 6 months to make sure there are no potential complications. I will readdress this issue with her at that time.   [facility], MD 8/11/20159:23 AM

## 2014-03-02 ENCOUNTER — Encounter: Payer: Self-pay | Admitting: Oncology

## 2014-06-03 ENCOUNTER — Ambulatory Visit (HOSPITAL_COMMUNITY): Payer: 59

## 2014-06-03 ENCOUNTER — Other Ambulatory Visit (HOSPITAL_COMMUNITY): Payer: Self-pay | Admitting: Internal Medicine

## 2014-06-03 DIAGNOSIS — M79662 Pain in left lower leg: Secondary | ICD-10-CM

## 2014-06-03 DIAGNOSIS — Z86718 Personal history of other venous thrombosis and embolism: Secondary | ICD-10-CM

## 2014-06-04 ENCOUNTER — Ambulatory Visit (HOSPITAL_COMMUNITY)
Admission: RE | Admit: 2014-06-04 | Discharge: 2014-06-04 | Disposition: A | Discharge status: Home / Self Care | Payer: 59 | Source: Ambulatory Visit | Attending: Internal Medicine | Admitting: Internal Medicine

## 2014-06-04 DIAGNOSIS — Z86718 Personal history of other venous thrombosis and embolism: Secondary | ICD-10-CM

## 2014-06-04 DIAGNOSIS — M79662 Pain in left lower leg: Secondary | ICD-10-CM

## 2014-06-04 DIAGNOSIS — M79605 Pain in left leg: Secondary | ICD-10-CM | POA: Insufficient documentation

## 2014-06-04 DIAGNOSIS — M79609 Pain in unspecified limb: Secondary | ICD-10-CM

## 2014-06-04 NOTE — Progress Notes (Signed)
VASCULAR LAB PRELIMINARY  PRELIMINARY  PRELIMINARY  PRELIMINARY  Left lower extremity venous duplex completed.    Preliminary report:  Chronic deep vein thrombosis noted throughout the left lower extremity with mild recanalization noted. The only change from the study of 01/04/2015 was that the subacute thrombus of the popliteal vein is now chronic. There is no obvious evidence of a superficial thrombus. There is no evidence of a Baker's cyst.  Madalina Rosman, RVS 06/04/2014, 3:53 PM

## 2014-06-05 ENCOUNTER — Encounter: Payer: Self-pay | Admitting: *Deleted

## 2014-06-11 ENCOUNTER — Telehealth: Payer: Self-pay | Admitting: Oncology

## 2014-06-11 ENCOUNTER — Ambulatory Visit (HOSPITAL_BASED_OUTPATIENT_CLINIC_OR_DEPARTMENT_OTHER): Payer: 59 | Admitting: Oncology

## 2014-06-11 VITALS — BP 119/77 | HR 73 | Temp 98.0°F | Resp 18 | Ht 65.0 in | Wt 135.3 lb

## 2014-06-11 DIAGNOSIS — I82409 Acute embolism and thrombosis of unspecified deep veins of unspecified lower extremity: Secondary | ICD-10-CM

## 2014-06-11 NOTE — Progress Notes (Signed)
Hematology and Oncology Follow Up Visit  Tiffany PingMyishie L [name] [id number] 19-Dec-1985 28 y.o. 06/11/2014 8:59 AM Renford DillsPOLITE,RONALD D, MDPolite, Deirdre Peeronald D, MD   Principle Diagnosis: 29 year old woman with history of multiple episodes of lower extremity deep vein thrombosis her first episode was in 2007 coinciding with her pregnancy state. Her second episode was in 2013 associated with pregnancy as well although it was unclear whether she had a chronic blood clot. Her most recent Dopplers in September of 2014 showed chronic deep vein thrombosis as well.   Current therapy: She is currently on Xarelto since September of 2014.  Interim History: Tiffany [name] presents today for a followup visit. Since her last visit, she stopped taking Xarelto on her own. She was doing well until February 2016 where she presented with left lower extremity pain. She had an ultrasound Doppler on 06/04/2014 which showed persistent deep vein thrombosis that is chronic in nature. She was restarted on Xarelto at that time. She has done well since that time and reports no complications. She has not reported any recent thrombosis or bleeding.   She does not report any headaches or blurry vision or syncope. Does not report any seizure or change in her mood. No fevers or or chills. She does not report any chest pain orthopnea or PND. Sample of any cough or hemoptysis she does report occasional wheezing. She does not report any nausea or vomiting or abdominal pain. Does not report any frequency urgency or hesitancy. She does not report any vaginal bleeding or hematuria. Rest of her review of systems unremarkable.  Medications: I have reviewed the patient's current medications.  Current Outpatient Prescriptions  Medication Sig Dispense Refill  . albuterol (PROVENTIL HFA;VENTOLIN HFA) 108 (90 BASE) MCG/ACT inhaler Inhale 2 puffs into the lungs every 6 (six) hours as needed for wheezing or shortness of breath.    . gabapentin (NEURONTIN) 100 MG  capsule Take 100 mg by mouth daily.    . Rivaroxaban (XARELTO) 20 MG TABS tablet Take 20 mg by mouth daily after lunch.      No current facility-administered medications for this visit.     Allergies: No Known Allergies  Past Medical History, Surgical history, Social history, and Family History were reviewed and updated.  Physical Exam: Blood pressure 119/77, pulse 73, temperature 98 F (36.7 C), temperature source Oral, resp. rate 18, height 5\' 5"  (1.651 m), weight 135 lb 4.8 oz (61.372 kg). ECOG: 0 General appearance: alert and cooperative appeared in no active distress. Head: Normocephalic, without obvious abnormality, atraumatic Neck: no adenopathy Lymph nodes: Cervical, supraclavicular, and axillary nodes normal. Heart:regular rate and rhythm, S1, S2 normal, no murmur, click, rub or gallop Lung:chest clear, no wheezing, rales, normal symmetric air entry Abdomin: soft, non-tender, without masses or organomegaly EXT:no erythema, induration, or nodules   Lab Results: Lab Results  Component Value Date   WBC 7.4 10/15/2013   HGB 12.5 10/15/2013   HCT 36.9 10/15/2013   MCV 88.5 10/15/2013   PLT 310 10/15/2013     Chemistry      Component Value Date/Time   NA 132* 11/20/2010 1720   K 4.4 11/20/2010 1720   CL 100 11/20/2010 1720   CO2 23 11/20/2010 1720   BUN 5* 11/20/2010 1720   CREATININE 0.60 11/20/2010 1720      Component Value Date/Time   CALCIUM 9.4 11/20/2010 1720   ALKPHOS 119* 11/20/2010 1720   AST 32 11/20/2010 1720   ALT 20 11/20/2010 1720   BILITOT 0.2* 11/20/2010 1720       -  Findings consistent with chronic deep vein thrombosis involving the entire left lower extremity. There is mild recanalization. The only changes since study of 12/2012 was that the subacute thrombus of the left popliteal vein is now chronic. - There is no evidnce of superficial trmbosis of the left lower extremity. - No evidence of Baker&'s cyst on the left.  Other  specific details can be found in the table(s) above. Prepared and Electronically Authenticated by   Impression and Plan:  29 year old woman with a history of a deep vein thrombosis potentially with multiple episodes involving the left lower extremity. Her first episode was in 2007 after giving birth for her second child. Since that time, she in treated with Lovenox during 2 other subsequent pregnancies.   Her hypercoagulable workup was unremarkable. Repeat ultrasound Doppler of February 2016 showed persistent chronic deep vein thrombosis.  Risks and benefits of continuing Xarelto was discussed today with the patient. Complications of long-term anticoagulation was discussed versus the risks of developing another blood clot. For the time being, she prefers to have some protection from any further blood clots and elected continue on Xarelto for the time being. We can repeat the ultrasound Doppler in 6 months to assess whether she can, Xarelto or not at that time.   [facility], MD 2/11/20168:59 AM

## 2014-06-11 NOTE — Telephone Encounter (Signed)
Gave avs & calendar for August °

## 2014-12-10 ENCOUNTER — Ambulatory Visit: Payer: Self-pay | Admitting: Oncology

## 2015-07-22 MED FILL — XARELTO 20 MG TABLET: 20 | 30 days supply | Qty: 30 | Fill #0

## 2015-08-19 ENCOUNTER — Encounter: Payer: Self-pay | Admitting: Family Medicine

## 2015-08-19 ENCOUNTER — Ambulatory Visit (INDEPENDENT_AMBULATORY_CARE_PROVIDER_SITE_OTHER): Payer: 59 | Admitting: Family Medicine

## 2015-08-19 VITALS — BP 110/60 | HR 64 | Wt 132.8 lb

## 2015-08-19 DIAGNOSIS — M722 Plantar fascial fibromatosis: Secondary | ICD-10-CM

## 2015-08-19 DIAGNOSIS — Z7189 Other specified counseling: Secondary | ICD-10-CM | POA: Diagnosis not present

## 2015-08-19 DIAGNOSIS — J45909 Unspecified asthma, uncomplicated: Secondary | ICD-10-CM | POA: Diagnosis not present

## 2015-08-19 DIAGNOSIS — J4520 Mild intermittent asthma, uncomplicated: Secondary | ICD-10-CM

## 2015-08-19 DIAGNOSIS — Z862 Personal history of diseases of the blood and blood-forming organs and certain disorders involving the immune mechanism: Secondary | ICD-10-CM | POA: Diagnosis not present

## 2015-08-19 DIAGNOSIS — I82502 Chronic embolism and thrombosis of unspecified deep veins of left lower extremity: Secondary | ICD-10-CM

## 2015-08-19 DIAGNOSIS — Z7689 Persons encountering health services in other specified circumstances: Secondary | ICD-10-CM

## 2015-08-19 DIAGNOSIS — Z7901 Long term (current) use of anticoagulants: Secondary | ICD-10-CM | POA: Diagnosis not present

## 2015-08-19 LAB — CBC WITH DIFFERENTIAL/PLATELET
Basophils Absolute: 0 cells/uL (ref 0–200)
Basophils Relative: 0 %
Eosinophils Absolute: 216 cells/uL (ref 15–500)
Eosinophils Relative: 3 %
HCT: 34.6 % — ABNORMAL LOW (ref 35.0–45.0)
HEMOGLOBIN: 11.5 g/dL — AB (ref 11.7–15.5)
LYMPHS ABS: 3816 {cells}/uL (ref 850–3900)
Lymphocytes Relative: 53 %
MCH: 29.6 pg (ref 27.0–33.0)
MCHC: 33.2 g/dL (ref 32.0–36.0)
MCV: 89.2 fL (ref 80.0–100.0)
MONOS PCT: 7 %
MPV: 9.9 fL (ref 7.5–12.5)
Monocytes Absolute: 504 cells/uL (ref 200–950)
Neutro Abs: 2664 cells/uL (ref 1500–7800)
Neutrophils Relative %: 37 %
Platelets: 372 10*3/uL (ref 140–400)
RBC: 3.88 MIL/uL (ref 3.80–5.10)
RDW: 13.1 % (ref 11.0–15.0)
WBC: 7.2 10*3/uL (ref 4.0–10.5)

## 2015-08-19 LAB — COMPREHENSIVE METABOLIC PANEL
ALBUMIN: 4.3 g/dL (ref 3.6–5.1)
ALT: 10 U/L (ref 6–29)
AST: 15 U/L (ref 10–30)
Alkaline Phosphatase: 28 U/L — ABNORMAL LOW (ref 33–115)
BILIRUBIN TOTAL: 0.4 mg/dL (ref 0.2–1.2)
BUN: 7 mg/dL (ref 7–25)
CO2: 27 mmol/L (ref 20–31)
Calcium: 9.9 mg/dL (ref 8.6–10.2)
Chloride: 101 mmol/L (ref 98–110)
Creat: 0.81 mg/dL (ref 0.50–1.10)
Glucose, Bld: 78 mg/dL (ref 65–99)
Potassium: 4.3 mmol/L (ref 3.5–5.3)
SODIUM: 137 mmol/L (ref 135–146)
TOTAL PROTEIN: 7.1 g/dL (ref 6.1–8.1)

## 2015-08-19 MED ORDER — ALBUTEROL SULFATE HFA 108 (90 BASE) MCG/ACT IN AERS: 2.0000 | INHALATION_SPRAY | Freq: Four times a day (QID) | RESPIRATORY_TRACT | Status: DC | PRN

## 2015-08-19 MED FILL — VENTOLIN HFA 90 MCG INHALER: 108 (90 BAS | 25 days supply | Qty: 18 | Fill #0

## 2015-08-19 NOTE — Progress Notes (Signed)
Subjective:    Patient ID: Tiffany [name], female    DOB: 01-28-1986, 30 y.o.   MRN: [id number]  HPI Chief Complaint  Patient presents with  . new pt    new pt get established. DVT in left legs and having pain. hasn't been seen for DVT since 2016. has anemia and been seing a hematologist for that at [facility] but been alittle bit   She is new to the practice and here to establish primary care. Her previous medical care has been with to [facility] Renford Dillsonald Polite, [facility] no medical records brought today.  She has a history of chronic DVT and has been taking Xarelto for past 3 years, but states she occasionally misses doses, this was prescribed by Dr. Clelia CroftShadad, Hematologist. She was followed by Dr. Clelia CroftShadad until she no showed her appointment in 11/2014. She last saw Dr. Clelia CroftShadad in 06/2014.   She also takes a mvi and iron. She has an albuterol inhaler for asthma- uses this every other day.   Asthma triggers are pollen and exercise.   She has an acute complaint of left foot pain x 2 days, pain is located to bottom of her foot and heel, is worse with weight bearing and walking, especially when she gets out of bed in morning.Marland Kitchen. Describes pain as a burning sensation.  Non radiating. Pain improves with rest. She has not been taking anything for this pain.   Other providers: Dr. Clelia CroftShadad Hematologist.  Last physical: 2015 Pap smear: 2015 She states she thinks she might be "bipolar", states her mood is very "up and down". States she has never been diagnosed with a mental illness or seen a therapist. She denies SI or HI today.   Lives with husband and 4 kids. [employer] and is going to cosmetology school.    Review of Systems Pertinent positives and negatives in the history of present illness.     Objective:   Physical Exam BP 110/60 mmHg  Pulse 64  Wt 132 lb 12.8 oz (60.238 kg)  Alert and in no distress.  Cardiac exam shows a regular sinus rhythm without murmurs or  gallops. Lungs are clear to auscultation. Left calf is soft,no erythema, bruising, or edema, nontender.negative homan's sign. Left foot is equally warm as right, normal capillary refill, pulses, sensation and ROM, focal point tenderness noted to medial calcaneus and pain reproduced by dorsiflexion of of toes, toes are nontender. Negative Thompson test.       Assessment & Plan:  Plantar fasciitis, left  Chronic deep vein thrombosis (DVT) of left lower extremity, unspecified vein (HCC) - Plan: CBC with Differential/Platelet, Comprehensive metabolic panel  Encounter to establish care  Asthma, mild intermittent, uncomplicated - Plan: albuterol (PROVENTIL HFA;VENTOLIN HFA) 108 (90 Base) MCG/ACT inhaler, CBC with Differential/Platelet, Comprehensive metabolic panel  Long term current use of anticoagulant therapy - Plan: CBC with Differential/Platelet, Comprehensive metabolic panel  History of anemia - Plan: CBC with Differential/Platelet  Discussed that her left foot pain appears to be related to plantar fascitis and discussed prevention such as wearing supportive shoes and treatment including ice, massage, stretching and since NSAIDS are contraindicated with Xarelto, she may take Tylenol for discomfort. If she is not improving in the next 2 weeks, will consider night splints.  Discussed that she should not need her inhaler more than twice during the day per month and no more than once during the night per month, if she is needing it more than this,  not including prevention for exercise, she will let me know and we will need to consider PFTs and maintenance inhaler.  Recommend she continue on daily Xarelto and call to schedule with Dr. Clelia Croft regarding chronic DVT and anemia since she has been treated by him in the past.  Discussed getting her records from Dr. Nehemiah Settle and having her return as needed for left foot pain. Also recommend she return for further evaluation of anxiety and depression, discussed  that I will refer her to psychiatrist for formal evaluation.  She will need to have a CPE and fasting labs at some point in the next few weeks.  Spent a minimum of 45 minutes with patient and at least 50% was in counseling and coordination of care.

## 2015-08-19 NOTE — Patient Instructions (Addendum)
Call and schedule an appointment with Dr. Clelia CroftShadad.  If you are needing your albuterol inhaler more than 2 times during the day for the month or more than one time at night, let me know. I recommend that you return for a complete physical exam and fasting blood work at [facility].   Plantar Fasciitis Plantar fasciitis is a painful foot condition that affects the heel. It occurs when the band of tissue that connects the toes to the heel bone (plantar fascia) becomes irritated. This can happen after exercising too much or doing other repetitive activities (overuse injury). The pain from plantar fasciitis can range from mild irritation to severe pain that makes it difficult for you to walk or move. The pain is usually worse in the morning or after you have been sitting or lying down for a while. CAUSES This condition may be caused by:  Standing for long periods of time.  Wearing shoes that do not fit.  Doing high-impact activities, including running, aerobics, and ballet.  Being overweight.  Having an abnormal way of walking (gait).  Having tight calf muscles.  Having high arches in your feet.  Starting a new athletic activity. SYMPTOMS The main symptom of this condition is heel pain. Other symptoms include:  Pain that gets worse after activity or exercise.  Pain that is worse in the morning or after resting.  Pain that goes away after you walk for a few minutes. DIAGNOSIS This condition may be diagnosed based on your signs and symptoms. Your health care provider will also do a physical exam to check for:  A tender area on the bottom of your foot.  A high arch in your foot.  Pain when you move your foot.  Difficulty moving your foot. You may also need to have imaging studies to confirm the diagnosis. These can include:  X-rays.  Ultrasound.  MRI. TREATMENT  Treatment for plantar fasciitis depends on the severity of the condition. Your treatment may  include:  Rest, ice, and over-the-counter pain medicines to manage your pain.  Exercises to stretch your calves and your plantar fascia.  A splint that holds your foot in a stretched, upward position while you sleep (night splint).  Physical therapy to relieve symptoms and prevent problems in the future.  Cortisone injections to relieve severe pain.  Extracorporeal shock wave therapy (ESWT) to stimulate damaged plantar fascia with electrical impulses. It is often used as a last resort before surgery.  Surgery, if other treatments have not worked after 12 months. HOME CARE INSTRUCTIONS  Take medicines only as directed by your health care provider.  Avoid activities that cause pain.  Roll the bottom of your foot over a bag of ice or a bottle of cold water. Do this for 20 minutes, 3-4 times a day.  Perform simple stretches as directed by your health care provider.  Try wearing athletic shoes with air-sole or gel-sole cushions or soft shoe inserts.  Wear a night splint while sleeping, if directed by your health care provider.  Keep all follow-up appointments with your health care provider. PREVENTION   Do not perform exercises or activities that cause heel pain.  Consider finding low-impact activities if you continue to have problems.  Lose weight if you need to. The best way to prevent plantar fasciitis is to avoid the activities that aggravate your plantar fascia. SEEK MEDICAL CARE IF:  Your symptoms do not go away after treatment with home care measures.  Your pain gets worse.  Your pain affects your ability to move or do your daily activities.   This information is not intended to replace advice given to you by your health care provider. Make sure you discuss any questions you have with your health care provider.   Document Released: 01/10/2001 Document Revised: 01/06/2015 Document Reviewed: 02/25/2014 Elsevier Interactive Patient Education [employer].

## 2015-08-20 ENCOUNTER — Telehealth: Payer: Self-pay | Admitting: Family Medicine

## 2015-08-20 NOTE — Telephone Encounter (Signed)
[facility]

## 2015-08-25 ENCOUNTER — Ambulatory Visit (INDEPENDENT_AMBULATORY_CARE_PROVIDER_SITE_OTHER): Payer: 59 | Admitting: Family Medicine

## 2015-08-25 ENCOUNTER — Encounter: Payer: Self-pay | Admitting: Family Medicine

## 2015-08-25 VITALS — BP 118/72 | HR 64 | Ht 66.0 in | Wt 133.2 lb

## 2015-08-25 DIAGNOSIS — Z23 Encounter for immunization: Secondary | ICD-10-CM | POA: Diagnosis not present

## 2015-08-25 DIAGNOSIS — Z124 Encounter for screening for malignant neoplasm of cervix: Secondary | ICD-10-CM

## 2015-08-25 DIAGNOSIS — K429 Umbilical hernia without obstruction or gangrene: Secondary | ICD-10-CM

## 2015-08-25 DIAGNOSIS — Z8349 Family history of other endocrine, nutritional and metabolic diseases: Secondary | ICD-10-CM | POA: Diagnosis not present

## 2015-08-25 DIAGNOSIS — Z13220 Encounter for screening for lipoid disorders: Secondary | ICD-10-CM | POA: Diagnosis not present

## 2015-08-25 DIAGNOSIS — Z8742 Personal history of other diseases of the female genital tract: Secondary | ICD-10-CM | POA: Insufficient documentation

## 2015-08-25 DIAGNOSIS — Z0000 Encounter for general adult medical examination without abnormal findings: Secondary | ICD-10-CM | POA: Diagnosis not present

## 2015-08-25 DIAGNOSIS — J4520 Mild intermittent asthma, uncomplicated: Secondary | ICD-10-CM | POA: Diagnosis not present

## 2015-08-25 LAB — POCT URINALYSIS DIPSTICK
BILIRUBIN UA: NEGATIVE
GLUCOSE UA: NEGATIVE
KETONES UA: NEGATIVE
Leukocytes, UA: NEGATIVE
Nitrite, UA: NEGATIVE
PH UA: 6
Protein, UA: NEGATIVE
RBC UA: NEGATIVE
Urobilinogen, UA: NEGATIVE

## 2015-08-25 NOTE — Patient Instructions (Addendum)
I am referring you to surgery for the unbilical hernia and they will contact you to schedule the initial appointment. I'm also referring you to gynecologist for history of abnormal Pap smears and screening for cervical cancer. Make sure you follow up with Dr. Clelia Croft.  You received your Tdap today.   Return for your fasting labs- schedule a lab visit for this.   Preventative Care for Adults - Female      MAINTAIN REGULAR HEALTH EXAMS:  A routine yearly physical is a good way to check in with your primary care provider about your health and preventive screening. It is also an opportunity to share updates about your health and any concerns you have, and receive a thorough all-over exam.   Most health insurance companies pay for at least some preventative services.  Check with your health plan for specific coverages.  WHAT PREVENTATIVE SERVICES DO WOMEN NEED?  Adult women should have their weight and blood pressure checked regularly.   Women age 64 and older should have their cholesterol levels checked regularly.  Women should be screened for cervical cancer with a Pap smear and pelvic exam beginning at either age 70, or 3 years after they become sexually activity.    Breast cancer screening generally begins at age 96 with a mammogram and breast exam by your primary care provider.    Beginning at age 68 and continuing to age 19, women should be screened for colorectal cancer.  Certain people may need continued testing until age 40.  Updating vaccinations is part of preventative care.  Vaccinations help protect against diseases such as the flu.  Osteoporosis is a disease in which the bones lose minerals and strength as we age. Women ages 79 and over should discuss this with their caregivers, as should women after menopause who have other risk factors.  Lab tests are generally done as part of preventative care to screen for anemia and blood disorders, to screen for problems with the kidneys  and liver, to screen for bladder problems, to check blood sugar, and to check your cholesterol level.  Preventative services generally include counseling about diet, exercise, avoiding tobacco, drugs, excessive alcohol consumption, and sexually transmitted infections.    GENERAL RECOMMENDATIONS FOR GOOD HEALTH:  Healthy diet:  Eat a variety of foods, including fruit, vegetables, animal or vegetable protein, such as meat, fish, chicken, and eggs, or beans, lentils, tofu, and grains, such as rice.  Drink plenty of water daily.  Decrease saturated fat in the diet, avoid lots of red meat, processed foods, sweets, fast foods, and fried foods.  Exercise:  Aerobic exercise helps maintain good heart health. At least 30-40 minutes of moderate-intensity exercise is recommended. For example, a brisk walk that increases your heart rate and breathing. This should be done on most days of the week.   Find a type of exercise or a variety of exercises that you enjoy so that it becomes a part of your daily life.  Examples are running, walking, swimming, water aerobics, and biking.  For motivation and support, explore group exercise such as aerobic class, spin class, Zumba, Yoga,or  martial arts, etc.    Set exercise goals for yourself, such as a certain weight goal, walk or run in a race such as a 5k walk/run.  Speak to your primary care provider about exercise goals.  Disease prevention:  If you smoke or chew tobacco, find out from your caregiver how to quit. It can literally save your life, no matter  how long you have been a tobacco user. If you do not use tobacco, never begin.   Maintain a healthy diet and normal weight. Increased weight leads to problems with blood pressure and diabetes.   The Body Mass Index or BMI is a way of measuring how much of your body is fat. Having a BMI above 27 increases the risk of heart disease, diabetes, hypertension, stroke and other problems related to obesity. Your  caregiver can help determine your BMI and based on it develop an exercise and dietary program to help you achieve or maintain this important measurement at a healthful level.  High blood pressure causes heart and blood vessel problems.  Persistent high blood pressure should be treated with medicine if weight loss and exercise do not work.   Fat and cholesterol leaves deposits in your arteries that can block them. This causes heart disease and vessel disease elsewhere in your body.  If your cholesterol is found to be high, or if you have heart disease or certain other medical conditions, then you may need to have your cholesterol monitored frequently and be treated with medication.   Ask if you should have a cardiac stress test if your history suggests this. A stress test is a test done on a treadmill that looks for heart disease. This test can find disease prior to there being a problem.  Menopause can be associated with physical symptoms and risks. Hormone replacement therapy is available to decrease these. You should talk to your caregiver about whether starting or continuing to take hormones is right for you.   Osteoporosis is a disease in which the bones lose minerals and strength as we age. This can result in serious bone fractures. Risk of osteoporosis can be identified using a bone density scan. Women ages 365 and over should discuss this with their caregivers, as should women after menopause who have other risk factors. Ask your caregiver whether you should be taking a calcium supplement and Vitamin D, to reduce the rate of osteoporosis.   Avoid drinking alcohol in excess (more than two drinks per day).  Avoid use of street drugs. Do not share needles with anyone. Ask for professional help if you need assistance or instructions on stopping the use of alcohol, cigarettes, and/or drugs.  Brush your teeth twice a day with fluoride toothpaste, and floss once a day. Good oral hygiene prevents tooth  decay and gum disease. The problems can be painful, unattractive, and can cause other health problems. Visit your dentist for a routine oral and dental check up and preventive care every 6-12 months.   Look at your skin regularly.  Use a mirror to look at your back. Notify your caregivers of changes in moles, especially if there are changes in shapes, colors, a size larger than a pencil eraser, an irregular border, or development of new moles.  Safety:  Use seatbelts 100% of the time, whether driving or as a passenger.  Use safety devices such as hearing protection if you work in environments with loud noise or significant background noise.  Use safety glasses when doing any work that could send debris in to the eyes.  Use a helmet if you ride a bike or motorcycle.  Use appropriate safety gear for contact sports.  Talk to your caregiver about gun safety.  Use sunscreen with a SPF (or skin protection factor) of 15 or greater.  Lighter skinned people are at a greater risk of skin cancer. Don't forget to also  wear sunglasses in order to protect your eyes from too much damaging sunlight. Damaging sunlight can accelerate cataract formation.   Practice safe sex. Use condoms. Condoms are used for birth control and to help reduce the spread of sexually transmitted infections (or STIs).  Some of the STIs are gonorrhea (the clap), chlamydia, syphilis, trichomonas, herpes, HPV (human papilloma virus) and HIV (human immunodeficiency virus) which causes AIDS. The herpes, HIV and HPV are viral illnesses that have no cure. These can result in disability, cancer and death.   Keep carbon monoxide and smoke detectors in your home functioning at all times. Change the batteries every 6 months or use a model that plugs into the wall.   Vaccinations:  Stay up to date with your tetanus shots and other required immunizations. You should have a booster for tetanus every 10 years. Be sure to get your flu shot every year,  since 5%-20% of the [place] population comes down with the flu. The flu vaccine changes each year, so being vaccinated once is not enough. Get your shot in the fall, before the flu season peaks.   Other vaccines to consider:  Human Papilloma Virus or HPV causes cancer of the cervix, and other infections that can be transmitted from person to person. There is a vaccine for HPV, and females should get immunized between the ages of 25 and 4. It requires a series of 3 shots.   Pneumococcal vaccine to protect against certain types of pneumonia.  This is normally recommended for adults age 85 or older.  However, adults younger than 31 years old with certain underlying conditions such as diabetes, heart or lung disease should also receive the vaccine.  Shingles vaccine to protect against Varicella Zoster if you are older than age 74, or younger than 31 years old with certain underlying illness.  Hepatitis A vaccine to protect against a form of infection of the liver by a virus acquired from food.  Hepatitis B vaccine to protect against a form of infection of the liver by a virus acquired from blood or body fluids, particularly if you work in health care.  If you plan to travel internationally, check with your local health department for specific vaccination recommendations.  Cancer Screening:  Breast cancer screening is essential to preventive care for women. All women age 44 and older should perform a breast self-exam every month. At age 51 and older, women should have their caregiver complete a breast exam each year. Women at ages 78 and older should have a mammogram (x-ray film) of the breasts. Your caregiver can discuss how often you need mammograms.    Cervical cancer screening includes taking a Pap smear (sample of cells examined under a microscope) from the cervix (end of the uterus). It also includes testing for HPV (Human Papilloma Virus, which can cause cervical cancer). Screening and a pelvic  exam should begin at age 85, or 3 years after a woman becomes sexually active. Screening should occur every year, with a Pap smear but no HPV testing, up to age 35. After age 54, you should have a Pap smear every 3 years with HPV testing, if no HPV was found previously.   Most routine colon cancer screening begins at the age of 37. On a yearly basis, doctors may provide special easy to use take-home tests to check for hidden blood in the stool. Sigmoidoscopy or colonoscopy can detect the earliest forms of colon cancer and is life saving. These tests use a small camera  at the end of a tube to directly examine the colon. Speak to your caregiver about this at age 49, when routine screening begins (and is repeated every 5 years unless early forms of pre-cancerous polyps or small growths are found).

## 2015-08-25 NOTE — Progress Notes (Signed)
Subjective:    Patient ID: Tiffany [name], female    DOB: 04/08/1986, 30 y.o.   MRN: [id number]  HPI Chief Complaint  Patient prFerdie Pingesents with  . cpe    cpe, pap smear   She is here for a complete physical exam and fasting labs. No medical record received yet from her previous PCP [facility], Ronald Polite.  Last physical in June 2016 at Dr. Nehemiah SettlePolite office.  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   She has an albuterol inhaler for asthma- uses this every other day.  Asthma triggers are pollen and exercise.   History of Reynauds. She was taking a beta blocker but does not know which medication.  Reports history of umbilical hernia and she never followed up with the surgeon. States she thinks the hernia has gotten larger.   Other providers: Dr. Clelia CroftShadad Hematologist.   P[facility] smear: 2015 [facility] states pap was abnormal. History of Colposcopy ?abnormal cells ?HPV  she did not follow up with GYN after the procedure.  History of Tubal ligation Periods are regular, bleeds for 2 days. No issues.   Tdap: unknown  Lives with husband and 4 kids. [place] [employer] and is going to cosmetology school.   Reviewed allergies, medications, past medical, surgical, family and social history.    Review of Systems Review of Systems Constitutional: -fever, -chills, -sweats, -unexpected weight change,-fatigue ENT: -runny nose, -ear pain, -sore throat Cardiology:  -chest pain, -palpitations, -edema Respiratory: -cough, -shortness of breath, -wheezing Gastroenterology: -abdominal pain, -nausea, -vomiting, -diarrhea, -constipation  Hematology: -bleeding or bruising problems Musculoskeletal: -arthralgias, -myalgias, -joint swelling, + chronic lumbar back pain Ophthalmology: -vision changes Urology: -dysuria, -difficulty urinating, -hematuria, -urinary frequency, -urgency Neurology: -headache, -weakness, -tingling, -numbness        Objective:    Physical Exam BP 118/72 mmHg  Pulse 64  Ht 5\' 6"  (1.676 m)  Wt 133 lb 3.2 oz (60.419 kg)  BMI 21.51 kg/m2  LMP 07/30/2015  General Appearance:    Alert, cooperative, no distress, appears stated age  Head:    Normocephalic, without obvious abnormality, atraumatic  Eyes:    PERRL, conjunctiva/corneas clear, EOM's intact, fundi    benign  Ears:    Normal TM's and external ear canals  Nose:   Nares normal, mucosa normal, no drainage or sinus   tenderness  Throat:   Lips, mucosa, and tongue normal; teeth and gums normal  Neck:   Supple, no lymphadenopathy;  thyroid:  no   enlargement/tenderness/nodules; no carotid   bruit or JVD  Back:    Spine nontender, no curvature, ROM normal, no CVA     tenderness  Lungs:     Clear to auscultation bilaterally without wheezes, rales or     ronchi; respirations unlabored  Chest Wall:    No tenderness or deformity   Heart:    Regular rate and rhythm, S1 and S2 normal, no murmur, rub   or gallop  Breast Exam:    Deferred. Referral to gynecologist per patient request. No axillary lymphadenopathy  Abdomen:     Soft, diffusely tender without rebound, guarding or referred pain, nondistended, normoactive bowel sounds,    no masses, no hepatosplenomegaly. Umbilical hernia without obstruction or gangrene.   Genitalia:    Deferred. Referral to gynecologist per patient request and abnormal pap smears in past.   Rectal:    Not performed due to age<40 and no related complaints  Extremities:   No  clubbing, cyanosis or edema  Pulses:   2+ and symmetric all extremities  Skin:   Skin color, texture, turgor normal, no rashes or lesions  Lymph nodes:   Cervical, supraclavicular, and axillary nodes normal  Neurologic:   CNII-XII intact, normal strength, sensation and gait; reflexes 2+ and symmetric throughout          Psych:   Normal mood, affect, hygiene and grooming.     Urinalysis dipstick: negative      Assessment & Plan:  Routine general medical examination  at a health care facility - Plan: POCT urinalysis dipstick, TSH, Lipid panel  Screening for lipid disorders - Plan: Lipid panel  History of abnormal cervical Pap smear - Plan: Ambulatory referral to Gynecology  Asthma, mild intermittent, uncomplicated  Screening for cervical cancer - Plan: Ambulatory referral to Gynecology  Need for Tdap vaccination - Plan: Tdap vaccine greater than or equal to 7yo IM  Family history of thyroid disease in mother - Plan: TSH  Umbilical hernia without obstruction and without gangrene - Plan: Ambulatory referral to General Surgery  Discussed that she appears to have a history of not following up with her providers and that I recommend that she follow up as scheduled with her hematologist and other providers as appropriate.  Asthma appears well controlled and she is aware of triggers. She has albuterol inhaler at home and will let me know if she is needing this often.  Plan to refer her to general surgeon for umbilical hernia evaluation and possible repair.  Referral made to OB/GYN for cervical cancer screening due to history of abnormal pap smear with procedure.  Discussed safety and health promotion.  Tdap given.  Will follow up pending labs. She will return for fasting labs, orders in chart for TSH and lipids.

## 2015-08-26 ENCOUNTER — Other Ambulatory Visit: Payer: 59

## 2015-08-26 ENCOUNTER — Encounter: Payer: Self-pay | Admitting: Internal Medicine

## 2015-08-26 DIAGNOSIS — Z8349 Family history of other endocrine, nutritional and metabolic diseases: Secondary | ICD-10-CM

## 2015-08-26 DIAGNOSIS — Z0000 Encounter for general adult medical examination without abnormal findings: Secondary | ICD-10-CM

## 2015-08-26 DIAGNOSIS — Z13220 Encounter for screening for lipoid disorders: Secondary | ICD-10-CM | POA: Diagnosis not present

## 2015-08-26 LAB — LIPID PANEL
Cholesterol: 99 mg/dL — ABNORMAL LOW (ref 125–200)
HDL: 53 mg/dL (ref >=46)
LDL CALC: 40 mg/dL (ref <130)
Total CHOL/HDL Ratio: 1.9 Ratio (ref <=5.0)
Triglycerides: 31 mg/dL (ref <150)
VLDL: 6 mg/dL (ref <30)

## 2015-08-26 LAB — TSH: TSH: 0.42 mIU/L

## 2015-08-27 ENCOUNTER — Telehealth: Payer: Self-pay | Admitting: Obstetrics and Gynecology

## 2015-08-27 NOTE — Telephone Encounter (Signed)
Called and left a message for patient to call back to schedule a new patient doctor referral.  Need to know where patient had prior paps and colposcopy so we can request records.

## 2015-09-06 ENCOUNTER — Ambulatory Visit: Payer: 59 | Admitting: Obstetrics and Gynecology

## 2015-09-08 ENCOUNTER — Ambulatory Visit: Payer: 59 | Admitting: Obstetrics and Gynecology

## 2015-09-13 ENCOUNTER — Encounter: Payer: Self-pay | Admitting: Internal Medicine

## 2015-10-14 DIAGNOSIS — Z01419 Encounter for gynecological examination (general) (routine) without abnormal findings: Secondary | ICD-10-CM | POA: Diagnosis not present

## 2015-10-14 DIAGNOSIS — Z6821 Body mass index (BMI) 21.0-21.9, adult: Secondary | ICD-10-CM | POA: Diagnosis not present

## 2015-10-27 DIAGNOSIS — R87615 Unsatisfactory cytologic smear of cervix: Secondary | ICD-10-CM | POA: Diagnosis not present

## 2015-11-24 DIAGNOSIS — N924 Excessive bleeding in the premenopausal period: Secondary | ICD-10-CM | POA: Diagnosis not present

## 2016-01-23 ENCOUNTER — Encounter: Payer: Self-pay | Admitting: *Deleted

## 2016-05-08 ENCOUNTER — Other Ambulatory Visit (INDEPENDENT_AMBULATORY_CARE_PROVIDER_SITE_OTHER): Payer: 59

## 2016-05-08 DIAGNOSIS — Z111 Encounter for screening for respiratory tuberculosis: Secondary | ICD-10-CM

## 2016-05-10 LAB — TB SKIN TEST: TB SKIN TEST: NEGATIVE

## 2016-05-15 ENCOUNTER — Encounter: Payer: Self-pay | Admitting: Family Medicine

## 2016-07-20 ENCOUNTER — Telehealth: Payer: Self-pay | Admitting: Oncology

## 2016-07-20 ENCOUNTER — Encounter: Payer: Self-pay | Admitting: Family Medicine

## 2016-07-20 ENCOUNTER — Encounter: Payer: Self-pay | Admitting: Neurology

## 2016-07-20 ENCOUNTER — Ambulatory Visit (INDEPENDENT_AMBULATORY_CARE_PROVIDER_SITE_OTHER): Payer: 59 | Admitting: Family Medicine

## 2016-07-20 VITALS — BP 122/88 | HR 89 | Temp 98.2°F | Resp 16 | Wt 142.6 lb

## 2016-07-20 DIAGNOSIS — Z7901 Long term (current) use of anticoagulants: Secondary | ICD-10-CM | POA: Diagnosis not present

## 2016-07-20 DIAGNOSIS — R413 Other amnesia: Secondary | ICD-10-CM

## 2016-07-20 DIAGNOSIS — R42 Dizziness and giddiness: Secondary | ICD-10-CM

## 2016-07-20 DIAGNOSIS — D6862 Lupus anticoagulant syndrome: Secondary | ICD-10-CM | POA: Diagnosis not present

## 2016-07-20 DIAGNOSIS — F99 Mental disorder, not otherwise specified: Secondary | ICD-10-CM | POA: Insufficient documentation

## 2016-07-20 DIAGNOSIS — R5383 Other fatigue: Secondary | ICD-10-CM

## 2016-07-20 DIAGNOSIS — R1084 Generalized abdominal pain: Secondary | ICD-10-CM | POA: Diagnosis not present

## 2016-07-20 DIAGNOSIS — R209 Unspecified disturbances of skin sensation: Secondary | ICD-10-CM

## 2016-07-20 DIAGNOSIS — Z8659 Personal history of other mental and behavioral disorders: Secondary | ICD-10-CM

## 2016-07-20 DIAGNOSIS — D649 Anemia, unspecified: Secondary | ICD-10-CM

## 2016-07-20 DIAGNOSIS — IMO0001 Reserved for inherently not codable concepts without codable children: Secondary | ICD-10-CM

## 2016-07-20 DIAGNOSIS — J4520 Mild intermittent asthma, uncomplicated: Secondary | ICD-10-CM

## 2016-07-20 DIAGNOSIS — Z91199 Patient's noncompliance with other medical treatment and regimen due to unspecified reason: Secondary | ICD-10-CM | POA: Insufficient documentation

## 2016-07-20 DIAGNOSIS — Z9119 Patient's noncompliance with other medical treatment and regimen: Secondary | ICD-10-CM

## 2016-07-20 HISTORY — DX: Patient's noncompliance with other medical treatment and regimen due to unspecified reason: Z91.199

## 2016-07-20 HISTORY — DX: Other amnesia: R41.3

## 2016-07-20 HISTORY — DX: Personal history of other mental and behavioral disorders: Z86.59

## 2016-07-20 LAB — POCT URINALYSIS DIPSTICK
Bilirubin, UA: NEGATIVE
Glucose, UA: NEGATIVE
KETONES UA: NEGATIVE
Leukocytes, UA: NEGATIVE
Nitrite, UA: NEGATIVE
PH UA: 6 (ref 5.0–8.0)
PROTEIN UA: NEGATIVE
RBC UA: NEGATIVE
SPEC GRAV UA: 1.025 (ref 1.030–1.035)
Urobilinogen, UA: NEGATIVE (ref ?–2.0)

## 2016-07-20 LAB — COMPREHENSIVE METABOLIC PANEL
ALK PHOS: 33 U/L (ref 33–115)
ALT: 11 U/L (ref 6–29)
AST: 17 U/L (ref 10–30)
Albumin: 4.7 g/dL (ref 3.6–5.1)
BUN: 8 mg/dL (ref 7–25)
CO2: 23 mmol/L (ref 20–31)
Calcium: 9.5 mg/dL (ref 8.6–10.2)
Chloride: 105 mmol/L (ref 98–110)
Creat: 0.83 mg/dL (ref 0.50–1.10)
Glucose, Bld: 96 mg/dL (ref 65–99)
Potassium: 4.7 mmol/L (ref 3.5–5.3)
Sodium: 137 mmol/L (ref 135–146)
TOTAL PROTEIN: 7.9 g/dL (ref 6.1–8.1)
Total Bilirubin: 0.4 mg/dL (ref 0.2–1.2)

## 2016-07-20 LAB — CBC WITH DIFFERENTIAL/PLATELET
Basophils Absolute: 0 cells/uL (ref 0–200)
Basophils Relative: 0 %
EOS PCT: 2 %
Eosinophils Absolute: 158 cells/uL (ref 15–500)
HCT: 39 % (ref 35.0–45.0)
Hemoglobin: 12.9 g/dL (ref 11.7–15.5)
LYMPHS PCT: 37 %
Lymphs Abs: 2923 cells/uL (ref 850–3900)
MCH: 29.5 pg (ref 27.0–33.0)
MCHC: 33.1 g/dL (ref 32.0–36.0)
MCV: 89 fL (ref 80.0–100.0)
MONOS PCT: 8 %
MPV: 10 fL (ref 7.5–12.5)
Monocytes Absolute: 632 cells/uL (ref 200–950)
NEUTROS PCT: 53 %
Neutro Abs: 4187 cells/uL (ref 1500–7800)
Platelets: 399 10*3/uL (ref 140–400)
RBC: 4.38 MIL/uL (ref 3.80–5.10)
RDW: 13.4 % (ref 11.0–15.0)
WBC: 7.9 10*3/uL (ref 4.0–10.5)

## 2016-07-20 LAB — POCT HEMOGLOBIN: HEMOGLOBIN: 12.8 g/dL (ref 12.2–16.2)

## 2016-07-20 LAB — GLUCOSE, POCT (MANUAL RESULT ENTRY): POC GLUCOSE: 98 mg/dL (ref 70–99)

## 2016-07-20 LAB — TSH: TSH: 0.72 mIU/L

## 2016-07-20 NOTE — Telephone Encounter (Signed)
Pt called to sch follow up appt. Gave pt new appt date/time 4/19 at 4 pm

## 2016-07-20 NOTE — Addendum Note (Signed)
Addended by: Avanell ShackletonHENSON, VICKIE L on: 07/20/2016 05:02 PM   Modules accepted: Orders

## 2016-07-20 NOTE — Patient Instructions (Addendum)
DO NOT take aleve, ibuprofen, aspirin, motrin, advil or any anti-inflammatory with the Xarelto. This puts you at an increased risk of bleeding.  You may take Tylenol for aches and pains and fever.

## 2016-07-20 NOTE — Progress Notes (Signed)
Subjective:    Patient ID: Tiffany [name], female    DOB: 1985/12/07, 31 y.o.   MRN: [id number]  HPI Chief Complaint  Patient presents with  . multiple issues    swollen tongue since last saturday, dizzness, tired, forgetful, knots on left side and back,    She is a pleasant 31 year old [place] American female with a complex history of Lupus, DVT, chronic anticoagulation, anemia, and well controlled asthma who is here with multiple complaints. She has a history of non compliance and not following up with providers as recommended. She is aware of this.   Complains of a 10 day history of dizziness, fatigue, sleepiness, forgetfulness, abdominal pain, and urinary frequency.  Dizziness is described as lightheadedness and has been present at least 3 out the past 5 days. Dizziness lasts all day. Nothing makes her symptoms worse or improves them.  States abdominal pain is generalized. Does not appear to be associated with eating, bowel movements or urination. Movement occasionally makes abdominal pain worse.   Also complains of a history of sleep walking and states the last time was last week. States she woke up and her left foot was hurting.   Reports having numbness, tingling to her upper and lower extremities. This is ongoing for several months, not worsening or improving. No focal weakness.   History of asthma and has not needed her inhaler and no recent attacks.   Denies fever, chills, weight loss, rash, arthralgias, myalgias, chest pain, palpitations, shortness of breath, wheezing, cough, vomiting, leg pain with ambulation, dysuria, urinary urgency, diarrhea, or LE edema.  Denies smoking, drinking alcohol or drug use.   States she forgot her kids at school one day last week. States she has 4 kids ages 20, 43, 45, 22. States she lost her job due to over sleeping and is not currently working. She is married.   Complains of her tongue has been burning and sores popping up occasionally with  certain foods.  Last dental exam 2 months. Dr. 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 Clelia Croft.  States she was diagnosed with Lupus while hospitalized in 2007 and has not followed up with a rheumatologist. Not sure if she ever saw a rheumatologist for this.   LMP: 06/30/2016 contraception: tubal ligation.  Has upcoming appointment with OB/GYN.  She did not follow up with [facility] for umbilical hernia.   Past Medical History:  Diagnosis Date  . Anemia 2007   HGB 11.04 Feb 2009.  Marland Kitchen Deep vein thrombosis (DVT) (HCC)    left leg  . Hernia, umbilical    plans for repair soon  . 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 Missed abortion 1/10   SP D&C  . Preterm labor   . SAB (spontaneous abortion) 1/09   Past Surgical History:  Procedure Laterality Date  . CESAREAN SECTION  [id number]  . CESAREAN SECTION  11/20/2010   Procedure: CESAREAN SECTION;  Surgeon: Brock Bad, MD;  Location: [facility];  Service: Gynecology;  Laterality: N/A;  repeat cesarean section   . CESAREAN SECTION  11/15/2011   Procedure: CESAREAN SECTION;  Surgeon: Kathreen Cosier, MD;  Location: [facility];  Service: Gynecology;  Laterality: N/A;  . DILATION AND CURETTAGE OF UTERUS  1/10   For missed AB  . HYSTEROSCOPY W/D&C N/A 10/15/2013   Procedure: HYSTEROSCOPY/DILATATION AND CURETTAGE;  Surgeon: Kathreen Cosier, MD;  Location: [facility];  Service:  Gynecology;  Laterality: N/A;  . TONSILLECTOMY     Reviewed allergies, medications, past medical, surgical, family, and social history.   Review of Systems Review of Systems Constitutional: -fever, -chills, -sweats, -unexpected weight change,+fatigue ENT: -runny nose, -ear pain, -sore throat Cardiology:  -chest pain, -palpitations, -edema Respiratory: -cough, -shortness of breath, -wheezing Gastroenterology: +abdominal pain, -nausea, -vomiting, -diarrhea, +constipation  Hematology: -bleeding or bruising problems, +DVT  history  Musculoskeletal: -arthralgias, -myalgias, -joint swelling, -back pain Ophthalmology: -vision changes Urology: -dysuria, -difficulty urinating, -hematuria, +urinary frequency, -urgency Neurology: +headache, -weakness, +tingling, +numbness        Objective:   Physical Exam  Constitutional: She is oriented to person, place, and time. She appears well-developed and well-nourished. No distress.  HENT:  Right Ear: Tympanic membrane and ear canal normal.  Left Ear: Tympanic membrane and ear canal normal.  Nose: Nose normal. Right sinus exhibits no maxillary sinus tenderness and no frontal sinus tenderness. Left sinus exhibits no maxillary sinus tenderness and no frontal sinus tenderness.  Mouth/Throat: Uvula is midline, oropharynx is clear and moist and mucous membranes are normal.  No rash or edema  Eyes: Conjunctivae, EOM and lids are normal. Pupils are equal, round, and reactive to light. No scleral icterus.  Neck: Trachea normal and normal range of motion. Neck supple. No thyromegaly present.  No stridor  Cardiovascular: Normal rate, regular rhythm, normal heart sounds and normal pulses.   Pulmonary/Chest: Effort normal and breath sounds normal.  Abdominal: Soft. Normal appearance and bowel sounds are normal. There is no hepatosplenomegaly. There is generalized tenderness. There is no rigidity, no rebound, no guarding and no CVA tenderness.  Tenderness to light palpation throughout, no rebound or referred pain. Umbilical hernia.   Lymphadenopathy:       Head (right side): No preauricular, no posterior auricular and no occipital adenopathy present.       Head (left side): No preauricular, no posterior auricular and no occipital adenopathy present.    She has no cervical adenopathy.    She has no axillary adenopathy.       Right: No supraclavicular adenopathy present.       Left: No supraclavicular adenopathy present.  Neurological: She is alert and oriented to person, place, and  time. She has normal strength and normal reflexes. She displays no tremor. No cranial nerve deficit or sensory deficit. She displays a negative Romberg sign. Coordination and gait normal. GCS eye subscore is 4. GCS verbal subscore is 5. GCS motor subscore is 6.  No pronator drift. Normal finger to nose, heel to shin, no focal deficits.   Skin: Skin is warm and dry. No rash noted. No erythema. No pallor.  Psychiatric: She has a normal mood and affect. Her speech is normal and behavior is normal. Thought content normal.   BP 122/88 (BP Location: Right Arm, Patient Position: Standing, Cuff Size: Normal)   Pulse 89   Temp 98.2 F (36.8 C) (Oral)   Resp 16   Wt 142 lb 9.6 oz (64.7 kg)   LMP 06/30/2016   SpO2 99%   BMI 23.02 kg/m       Assessment & Plan:  Other fatigue - Plan: POCT hemoglobin, POCT glucose (manual entry), CBC with Differential/Platelet, Comprehensive metabolic panel, POCT urinalysis dipstick, VITAMIN D 25 Hydroxy (Vit-D Deficiency, Fractures), Vitamin B12, TSH  Paresthesias/numbness - Plan: CBC with Differential/Platelet, Comprehensive metabolic panel, POCT urinalysis dipstick, RPR, HIV antibody, Vitamin B12, TSH, Sedimentation rate, Ambulatory referral to Neurology  Memory problem - Plan: RPR, HIV antibody,  Vitamin B12, TSH, Ambulatory referral to Neurology  Generalized abdominal pain - Plan: Comprehensive metabolic panel  Anemia, unspecified type - Plan: CBC with Differential/Platelet  Mild intermittent asthma without complication  Lupus anticoagulant disorder (HCC) - Plan: CBC with Differential/Platelet, Comprehensive metabolic panel, POCT urinalysis dipstick, Sedimentation rate, Ambulatory referral to Rheumatology  Chronic anticoagulation  Dizziness - Plan: CBC with Differential/Platelet, Comprehensive metabolic panel, Orthostatic vital signs, Ambulatory referral to Neurology  History of sleep walking - Plan: Ambulatory referral to Neurology  Abnormal MMSE -  Plan: Ambulatory referral to Neurology  Personal history of noncompliance with medical treatment, presenting hazards to health  Discussed that her neurological exam is unremarkable and she does not appear to be having an acute neurological event. She is not toxic or sick appearing.  Abnormal MMSE 24/29 Not orthostatic POCT glucose and hemoglobin are within normal limits.  Urinalysis dipstick is negative.  Plan to look for etiologies for fatigue. Will check labs.  Plan to refer to neurology for abnormal MMSE, memory issues, dizziness, paresthesias, and sleep walking.  Plan to refer to rheumatology for Lupus. As far as I can tell she has never seen anyone for this condition.  She will continue seeing Dr. Clelia CroftShadad for chronic anticoagulation and DVT.  She plans to follow with her OB/GYN as well.  Advised patient on the importance of following up with her providers and taking her medical conditions more seriously since ignoring her symptoms puts her at an increased risk for worsening health.  Verbalized understanding and agrees to start taking better care of herself.  Will follow up pending labs.

## 2016-07-21 ENCOUNTER — Other Ambulatory Visit: Payer: 59

## 2016-07-21 ENCOUNTER — Other Ambulatory Visit: Payer: Self-pay | Admitting: Family Medicine

## 2016-07-21 DIAGNOSIS — E559 Vitamin D deficiency, unspecified: Secondary | ICD-10-CM | POA: Diagnosis not present

## 2016-07-21 DIAGNOSIS — R209 Unspecified disturbances of skin sensation: Principal | ICD-10-CM

## 2016-07-21 DIAGNOSIS — R413 Other amnesia: Secondary | ICD-10-CM

## 2016-07-21 DIAGNOSIS — IMO0001 Reserved for inherently not codable concepts without codable children: Secondary | ICD-10-CM

## 2016-07-21 LAB — SEDIMENTATION RATE: Sed Rate: 1 mm/hr (ref 0–20)

## 2016-07-21 LAB — HIV ANTIBODY (ROUTINE TESTING W REFLEX): HIV: NONREACTIVE

## 2016-07-21 LAB — VITAMIN D 25 HYDROXY (VIT D DEFICIENCY, FRACTURES): Vit D, 25-Hydroxy: 12 ng/mL — ABNORMAL LOW (ref 30–100)

## 2016-07-21 LAB — VITAMIN B12: Vitamin B-12: 475 pg/mL (ref 200–1100)

## 2016-07-21 MED ORDER — VITAMIN D (ERGOCALCIFEROL) 1.25 MG (50000 UNIT) PO CAPS: 50000.0000 [IU] | ORAL_CAPSULE | ORAL | 0 refills | Status: DC

## 2016-07-22 ENCOUNTER — Emergency Department (HOSPITAL_COMMUNITY): Payer: 59

## 2016-07-22 ENCOUNTER — Emergency Department (HOSPITAL_COMMUNITY)
Admission: EM | Admit: 2016-07-22 | Discharge: 2016-07-22 | Disposition: A | Discharge status: Home / Self Care | Payer: 59 | Attending: Emergency Medicine | Admitting: Emergency Medicine

## 2016-07-22 ENCOUNTER — Encounter (HOSPITAL_COMMUNITY): Payer: Self-pay | Admitting: Emergency Medicine

## 2016-07-22 DIAGNOSIS — R079 Chest pain, unspecified: Secondary | ICD-10-CM | POA: Diagnosis not present

## 2016-07-22 DIAGNOSIS — R071 Chest pain on breathing: Secondary | ICD-10-CM | POA: Diagnosis not present

## 2016-07-22 DIAGNOSIS — J45909 Unspecified asthma, uncomplicated: Secondary | ICD-10-CM | POA: Diagnosis not present

## 2016-07-22 DIAGNOSIS — Z7901 Long term (current) use of anticoagulants: Secondary | ICD-10-CM | POA: Insufficient documentation

## 2016-07-22 DIAGNOSIS — R0602 Shortness of breath: Secondary | ICD-10-CM | POA: Diagnosis not present

## 2016-07-22 HISTORY — DX: Unspecified asthma, uncomplicated: J45.909

## 2016-07-22 LAB — COMPREHENSIVE METABOLIC PANEL
ALBUMIN: 4.4 g/dL (ref 3.5–5.0)
ALT: 16 U/L (ref 14–54)
ANION GAP: 10 (ref 5–15)
AST: 25 U/L (ref 15–41)
Alkaline Phosphatase: 32 U/L — ABNORMAL LOW (ref 38–126)
BILIRUBIN TOTAL: 0.5 mg/dL (ref 0.3–1.2)
BUN: 9 mg/dL (ref 6–20)
CALCIUM: 9.9 mg/dL (ref 8.9–10.3)
CO2: 23 mmol/L (ref 22–32)
CREATININE: 0.93 mg/dL (ref 0.44–1.00)
Chloride: 106 mmol/L (ref 101–111)
GFR calc non Af Amer: >60 mL/min (ref >60)
GLUCOSE: 97 mg/dL (ref 65–99)
POTASSIUM: 4.2 mmol/L (ref 3.5–5.1)
SODIUM: 139 mmol/L (ref 135–145)
TOTAL PROTEIN: 7.3 g/dL (ref 6.5–8.1)

## 2016-07-22 LAB — I-STAT BETA HCG BLOOD, ED (MC, WL, AP ONLY): I-stat hCG, quantitative: <5 m[IU]/mL (ref <5)

## 2016-07-22 LAB — URINALYSIS, ROUTINE W REFLEX MICROSCOPIC
BILIRUBIN URINE: NEGATIVE
GLUCOSE, UA: NEGATIVE mg/dL
HGB URINE DIPSTICK: NEGATIVE
Ketones, ur: NEGATIVE mg/dL
Leukocytes, UA: NEGATIVE
NITRITE: NEGATIVE
PH: 5 (ref 5.0–8.0)
Protein, ur: NEGATIVE mg/dL
SPECIFIC GRAVITY, URINE: 1.023 (ref 1.005–1.030)

## 2016-07-22 LAB — CBC
HCT: 37.6 % (ref 36.0–46.0)
Hemoglobin: 12.5 g/dL (ref 12.0–15.0)
MCH: 29.9 pg (ref 26.0–34.0)
MCHC: 33.2 g/dL (ref 30.0–36.0)
MCV: 90 fL (ref 78.0–100.0)
PLATELETS: 364 10*3/uL (ref 150–400)
RBC: 4.18 MIL/uL (ref 3.87–5.11)
RDW: 13 % (ref 11.5–15.5)
WBC: 9.5 10*3/uL (ref 4.0–10.5)

## 2016-07-22 LAB — I-STAT TROPONIN, ED
TROPONIN I, POC: 0 ng/mL (ref 0.00–0.08)
Troponin i, poc: 0 ng/mL (ref 0.00–0.08)

## 2016-07-22 LAB — BRAIN NATRIURETIC PEPTIDE: B Natriuretic Peptide: 8 pg/mL (ref 0.0–100.0)

## 2016-07-22 LAB — RPR

## 2016-07-22 LAB — POC URINE PREG, ED: Preg Test, Ur: NEGATIVE

## 2016-07-22 MED ORDER — ACETAMINOPHEN 500 MG PO TABS: 500.0000 mg | ORAL_TABLET | Freq: Four times a day (QID) | ORAL | 0 refills | Status: DC | PRN

## 2016-07-22 MED ORDER — IOPAMIDOL (ISOVUE-370) INJECTION 76%
INTRAVENOUS | Status: AC
  Administered 2016-07-22: 100 mL
  Filled 2016-07-22: qty 100

## 2016-07-22 MED ORDER — MORPHINE SULFATE (PF) 4 MG/ML IV SOLN
4.0000 mg | Freq: Once | INTRAVENOUS | Status: AC
  Administered 2016-07-22: 4 mg via INTRAVENOUS
  Filled 2016-07-22: qty 1

## 2016-07-22 MED ORDER — LORAZEPAM 2 MG/ML IJ SOLN
0.5000 mg | Freq: Once | INTRAMUSCULAR | Status: AC
  Administered 2016-07-22: 0.5 mg via INTRAVENOUS
  Filled 2016-07-22: qty 1

## 2016-07-22 MED ORDER — LORAZEPAM 1 MG PO TABS: 0.5000 mg | ORAL_TABLET | Freq: Four times a day (QID) | ORAL | 0 refills | Status: DC | PRN

## 2016-07-22 MED ORDER — ONDANSETRON HCL 4 MG/2ML IJ SOLN
4.0000 mg | Freq: Once | INTRAMUSCULAR | Status: DC
  Filled 2016-07-22: qty 2

## 2016-07-22 NOTE — ED Notes (Signed)
Pt. Walked to the bathroom. 

## 2016-07-22 NOTE — ED Notes (Signed)
Went in to give pt. zofran pt now states that she feels better and does not want the Zofran ER PA made aware

## 2016-07-22 NOTE — ED Triage Notes (Signed)
Pt. Called the nurse back into room (me) and stated, Im not suicidal, I was just under a lot of stress. Cancelled the sitter and wanding.

## 2016-07-22 NOTE — ED Triage Notes (Signed)
Pt. Stated, I woke up with chest pain and SOB, its happened before just not this bad.

## 2016-07-22 NOTE — ED Notes (Signed)
Morphine given pt now c/o nausea ER PA made aware orders received

## 2016-07-22 NOTE — ED Provider Notes (Signed)
[facility] Provider Note   CSN: [id number] Arrival date & time: 07/22/16  4098     History   Chief Complaint Chief Complaint  Patient presents with  . Chest Pain  . Shortness of Breath  . Suicidal    HPI Tiffany [name] is a 31 y.o. female with history of asthma, DVT anticoagulated on Xarelto (noncompliant, last dose last week), recent diagnosis of lupus, and anemia who presents with acute onset chest pain and shortness of breath. Patient states she was woken up out of sleep with sharp central chest pain. Pain does not radiate. Pain has been intermittent but severe. She states her pain is worse when talking, breathing, and laying flat. Her pain is improved with leaning forward and standing up. Patient did not take any medications prior to arrival. Patient denies any associated fevers, abdominal pain, nausea, vomiting, urinary symptoms. Patient states that this does not feel like an asthma attack. Patient denies any recent long trips, cancer, surgeries, exogenous estrogen use, new leg pain or swelling. Patient is unsure of family cardiac history. Patient initially reported suicidality at triage, however on my evaluation patient denies SI or HI and states that she was just in pain and under stress.  HPI  Past Medical History:  Diagnosis Date  . Anemia 2007   HGB 11.04 Feb 2009.  Marland Kitchen Asthma   . Deep vein thrombosis (DVT) (HCC)    left leg  . Hernia, umbilical    plans for repair soon  . 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 Missed abortion 1/10   SP D&C  . Preterm labor   . SAB (spontaneous abortion) 1/09    Patient Active Problem List   Diagnosis Date Noted  . Vitamin D deficiency 07/21/2016  . Paresthesias/numbness 07/20/2016  . Memory problem 07/20/2016  . Generalized abdominal pain 07/20/2016  . Chronic anticoagulation 07/20/2016  . History of sleep walking 07/20/2016  . Abnormal MMSE 07/20/2016  . Personal history of noncompliance with  medical treatment, presenting hazards to health 07/20/2016  . History of abnormal cervical Pap smear 08/25/2015  . Asthma, mild intermittent 08/25/2015  . Previous cesarean section 11/20/2010  . SROM (spontaneous rupture of membranes) 11/20/2010  . Early or threatened labor 11/20/2010  . Anemia 02/18/2009  . PRIMARY HYPERCOAGULABLE STATE 02/18/2009  . Acute thromboembolism of deep veins of lower extremity (HCC) 02/18/2009  . MISSED ABORTION 02/18/2009  . ABORTION, SPONTANEOUS 02/18/2009  . Lupus anticoagulant disorder (HCC) 03/01/2006    Past Surgical History:  Procedure Laterality Date  . CESAREAN SECTION  [id number]  . CESAREAN SECTION  11/20/2010   Procedure: CESAREAN SECTION;  Surgeon: Brock Bad, MD;  Location: [facility];  Service: Gynecology;  Laterality: N/A;  repeat cesarean section   . CESAREAN SECTION  11/15/2011   Procedure: CESAREAN SECTION;  Surgeon: Kathreen Cosier, MD;  Location: [facility];  Service: Gynecology;  Laterality: N/A;  . DILATION AND CURETTAGE OF UTERUS  1/10   For missed AB  . HYSTEROSCOPY W/D&C N/A 10/15/2013   Procedure: HYSTEROSCOPY/DILATATION AND CURETTAGE;  Surgeon: Kathreen Cosier, MD;  Location: [facility];  Service: Gynecology;  Laterality: N/A;  . TONSILLECTOMY      OB History    Gravida Para Term Preterm AB Living   8 4 2 2 4 4    SAB TAB Ectopic Multiple Live Births   4       2       Home Medications    Prior  to Admission medications   Medication Sig Start Date End Date Taking? Authorizing Provider  albuterol (PROVENTIL HFA;VENTOLIN HFA) 108 (90 Base) MCG/ACT inhaler Inhale 2 puffs into the lungs every 6 (six) hours as needed for wheezing or shortness of breath. 08/19/15  Yes Vickie L Henson, NP-C  Iron Combinations (IRON COMPLEX PO) Take 10 mg by mouth daily.   Yes Historical Provider, MD  Multiple Vitamin (MULTIVITAMIN) tablet Take 1 tablet by mouth daily.   Yes Historical Provider, MD  Rivaroxaban (XARELTO) 20 MG TABS tablet Take 20 mg  by mouth daily after lunch.    Yes Historical Provider, MD  acetaminophen (TYLENOL) 500 MG tablet Take 1 tablet (500 mg total) by mouth every 6 (six) hours as needed. 07/22/16   Helmer Dull M Hiliary Osorto, PA-C  LORazepam (ATIVAN) 1 MG tablet Take 0.5 tablets (0.5 mg total) by mouth every 6 (six) hours as needed for anxiety. 07/22/16   Emi Holes, PA-C  Vitamin D, Ergocalciferol, (DRISDOL) 50000 units CAPS capsule Take 1 capsule (50,000 Units total) by mouth every 7 (seven) days. 07/21/16   Avanell Shackleton, NP-C    Family History Family History  Problem Relation Age of Onset  . Hyperlipidemia Mother   . Hypertension Mother   . Arthritis Mother   . Hypertension Father   . Hyperlipidemia Father   . Diabetes Father   . Stroke Father   . Cataracts Father   . Other Neg Hx     Social History Social History  Substance Use Topics  . Smoking status: Never Smoker  . Smokeless tobacco: Never Used  . Alcohol use No     Allergies   Patient has no known allergies.   Review of Systems Review of Systems  Constitutional: Negative for chills and fever.  HENT: Negative for facial swelling and sore throat.   Respiratory: Positive for shortness of breath.   Cardiovascular: Positive for chest pain. Negative for leg swelling.  Gastrointestinal: Negative for abdominal pain, nausea and vomiting.  Genitourinary: Negative for dysuria.  Musculoskeletal: Negative for back pain.  Skin: Negative for rash and wound.  Neurological: Negative for headaches.  Psychiatric/Behavioral: The patient is not nervous/anxious.      Physical Exam Updated Vital Signs BP 110/79   Pulse 72   Temp 97.9 F (36.6 C) (Oral)   Resp 17   Ht 5\' 6"  (1.676 m)   Wt 64.4 kg   LMP 06/30/2016   SpO2 91%   BMI 22.92 kg/m   Physical Exam  Constitutional: She appears well-developed and well-nourished. No distress.  Patient in obvious distress due to pain  HENT:  Head: Normocephalic and atraumatic.  Mouth/Throat: Oropharynx  is clear and moist. No oropharyngeal exudate.  Eyes: Conjunctivae are normal. Pupils are equal, round, and reactive to light. Right eye exhibits no discharge. Left eye exhibits no discharge. No scleral icterus.  Neck: Normal range of motion. Neck supple. No thyromegaly present.  Cardiovascular: Normal rate, regular rhythm, normal heart sounds and intact distal pulses.  Exam reveals no gallop and no friction rub.   No murmur heard. No friction rub auscultated with patient leaning forward  Pulmonary/Chest: Effort normal and breath sounds normal. No stridor. No respiratory distress. She has no wheezes. She has no rales. She exhibits no tenderness.  Abdominal: Soft. Bowel sounds are normal. She exhibits no distension. There is no tenderness. There is no rebound and no guarding.  Musculoskeletal: She exhibits no edema.  Lymphadenopathy:    She has no cervical adenopathy.  Neurological: She is alert. Coordination normal.  Skin: Skin is warm and dry. No rash noted. She is not diaphoretic. No pallor.  Psychiatric: She has a normal mood and affect.  Nursing note and vitals reviewed.    ED Treatments / Results  Labs (all labs ordered are listed, but only abnormal results are displayed) Labs Reviewed  COMPREHENSIVE METABOLIC PANEL - Abnormal; Notable for the following:       Result Value   Alkaline Phosphatase 32 (*)    All other components within normal limits  URINALYSIS, ROUTINE W REFLEX MICROSCOPIC - Abnormal; Notable for the following:    APPearance HAZY (*)    All other components within normal limits  CBC  BRAIN NATRIURETIC PEPTIDE  I-STAT TROPOININ, ED  POC URINE PREG, ED  I-STAT BETA HCG BLOOD, ED ([facility], [facility], [facility] ONLY)  I-STAT TROPOININ, ED    EKG  EKG Interpretation  Date/Time:  Saturday July 22 2016 07:39:44 EDT Ventricular Rate:  110 PR Interval:  144 QRS Duration: 80 QT Interval:  330 QTC Calculation: 446 R Axis:   89 Text Interpretation:  Sinus tachycardia Nonspecific  ST and T wave abnormality Abnormal ECG No old tracing to compare Confirmed by [facility] MD, Barbara Cower [phone]) on 07/22/2016 8:28:52 AM Also confirmed by [facility] MD, Barbara Cower [phone]), editor Misty Stanley [phone])  on 07/22/2016 10:26:46 AM       Radiology Ct Angio Chest Pe W And/or Wo Contrast  Result Date: 07/22/2016 CLINICAL DATA:  Chest pain and shortness of breath starting this morning EXAM: CT ANGIOGRAPHY CHEST WITH CONTRAST TECHNIQUE: Multidetector CT imaging of the chest was performed using the standard protocol during bolus administration of intravenous contrast. Multiplanar CT image reconstructions and MIPs were obtained to evaluate the vascular anatomy. CONTRAST:  CT scan 01/03/2013 COMPARISON:  80 cc Isovue FINDINGS: Cardiovascular: No evidence of aortic aneurysm or aortic dissection. Heart size within normal limits. No pericardial effusion. The study is of excellent technical quality. No pulmonary embolus is noted. Mediastinum/Nodes: No mediastinal hematoma or adenopathy. No hilar adenopathy. Lungs/Pleura: Images of the lung parenchyma shows no infiltrate or pulmonary edema. No pleural thickening or pleural plaques. No bronchiectasis. No emphysema. No pulmonary mass. No focal consolidation. No pneumothorax. Upper Abdomen: The visualized pancreas and spleen is unremarkable. Musculoskeletal: No destructive bony lesions are noted. Sagittal images of the spine shows alignment, disc spaces and vertebral body heights to be preserved. Sagittal view of the sternum is unremarkable. No rib fractures are identified. Review of the MIP images confirms the above findings. IMPRESSION: 1. No pulmonary embolus is noted. 2. No infiltrate or pulmonary edema. 3. No mediastinal hematoma or adenopathy. Electronically Signed   By: Natasha Mead M.D.   On: 07/22/2016 11:03    Procedures Procedures (including critical care time)  Medications Ordered in ED Medications  iopamidol (ISOVUE-370) 76 % injection (100 mLs   Contrast Given 07/22/16 0958)  morphine 4 MG/ML injection 4 mg (4 mg Intravenous Given 07/22/16 1037)  LORazepam (ATIVAN) injection 0.5 mg (0.5 mg Intravenous Given 07/22/16 1127)     Initial Impression / Assessment and Plan / ED Course  I have reviewed the triage vital signs and the nursing notes.  Pertinent labs & imaging results that were available during my care of the patient were reviewed by me and considered in my medical decision making (see chart for details).     Patient offered pain medication, however she declined and said she wouldn't take any.  1012 Upon return from CT, patient requesting  pain medicine. Will order 4mg  morphine.  CBC, CMP, BNP unremarkable. Delta troponin negative. UA negative. Urine pregnancy negative. CT angios chest shows no pulmonary embolus, infiltrate, pulmonary edema, mediastinal hematoma, or adenopathy. EKG shows sinus tachycardia, Nonspecific ST and T-wave irritability. Patient's tachycardia improved. Patient's symptoms beginning to improve following Ativan. Patient does note she has been under a lot of stress lately. Suspect anxiety as the cause. Patient to follow up with PCP in 2-3 days for further evaluation and treatment of symptoms. We'll discharge patient home with short course of Ativan, as well as Tylenol for pain control. Return precautions discussed. Patient understands and agrees with plan. Patient vitals stable at discharge and patient discharged in satisfactory condition. I discussed patient case with Dr. Clayborne DanaMesner who guided the patient's management and agrees with plan.  Final Clinical Impressions(s) / ED Diagnoses   Final diagnoses:  Chest pain on breathing  Shortness of breath    New Prescriptions New Prescriptions   ACETAMINOPHEN (TYLENOL) 500 MG TABLET    Take 1 tablet (500 mg total) by mouth every 6 (six) hours as needed.   LORAZEPAM (ATIVAN) 1 MG TABLET    Take 0.5 tablets (0.5 mg total) by mouth every 6 (six) hours as needed for  anxiety.     Emi Holeslexandra M Julianna Vanwagner, PA-C 07/22/16 1152    Marily MemosJason Mesner, MD 07/22/16 [phone]

## 2016-07-22 NOTE — ED Notes (Signed)
Patient transported to CT 

## 2016-07-22 NOTE — Discharge Instructions (Signed)
Medications: Ativan, Tylenol  Treatment: Take 1/2-1 pill every 6 hours as needed for your symptoms related to anxiety. Take Tylenol every 6 hours as needed for your pain.  Follow-up: Please see your primary care provider in 2-3 days for follow-up of today's visit and further evaluation and treatment of your symptoms. Please return to the emergency department if you develop any new or worsening symptoms.

## 2016-07-24 ENCOUNTER — Ambulatory Visit (INDEPENDENT_AMBULATORY_CARE_PROVIDER_SITE_OTHER): Payer: 59 | Admitting: Family Medicine

## 2016-07-24 ENCOUNTER — Encounter: Payer: Self-pay | Admitting: Family Medicine

## 2016-07-24 VITALS — BP 128/82 | HR 76 | Resp 16 | Wt 142.0 lb

## 2016-07-24 DIAGNOSIS — F4323 Adjustment disorder with mixed anxiety and depressed mood: Secondary | ICD-10-CM | POA: Diagnosis not present

## 2016-07-24 DIAGNOSIS — F418 Other specified anxiety disorders: Secondary | ICD-10-CM

## 2016-07-24 DIAGNOSIS — R0789 Other chest pain: Secondary | ICD-10-CM | POA: Diagnosis not present

## 2016-07-24 MED ORDER — TRAMADOL HCL 50 MG PO TABS: 50.0000 mg | ORAL_TABLET | Freq: Three times a day (TID) | ORAL | 0 refills | Status: DC | PRN

## 2016-07-24 NOTE — Progress Notes (Signed)
Subjective:    Patient ID: Tiffany [name], female    DOB: [birth year], 31 y.o.   MRN: [id number]  HPI Chief Complaint  Patient presents with  . follow-up    follow-up ER. still having a tightness in chest   She is here for a follow up from the ED for chest pain. She was worked up for sharp central non radiating chest pain and ruled out for cardiopulmonary etiology. Review of the ED note showed unremarkable labs, delta troponin negative, UA negative, Urine pregnancy negative and negative CTA. No evidence of PE. She was given ativan and did have some relief. Symptoms appeared to be anxiety related. She was prescribed Ativan and Tylenol.  Since leaving the ED, she reports the chest pain has persisted and is present with movement and deep inspiration. She has tried Tylenol and Ativan with some relief. No new symptoms.   Denies fever, chills, dizziness, palpitations, shortness of breath, cough, abdominal pain, N/V/D, LE edema. No calf or leg pain. No claudication.  She admits to being noncompliant with taking her Xarelto. States she "forgets" to take it.   She is tearful and wants to discuss feeling depressed and anxious. Denies SI or HI. She reports being worried about an upcoming cosmetology exam. States she deals with stress by sleeping.  States she has been drinking wine and studying for an upcoming cosmetology test. States she is stressed and worried because she has failed the written exam 6 times. States she has a difficult time reading all of the information and just starts checking boxes. She also reports getting distracted easily and having a difficult time staying on task. Denies history of learning disability and has never been tested for ADHD.    Depression screen PHQ 2/9 07/24/2016  Decreased Interest 3  Down, Depressed, Hopeless 3  PHQ - 2 Score 6  Altered sleeping 3  Tired, decreased energy 3  Change in appetite 3  Feeling bad or failure about yourself  3  Trouble  concentrating 3  Moving slowly or fidgety/restless 3  Suicidal thoughts 2  PHQ-9 Score 26   GAD 7 : Generalized Anxiety Score 07/24/2016  Nervous, Anxious, on Edge 3  Control/stop worrying 3  Worry too much - different things 3  Trouble relaxing 3  Restless 3  Easily annoyed or irritable 3  Afraid - awful might happen 1  Total GAD 7 Score 19  Anxiety Difficulty Not difficult at all   Reviewed allergies, medications, past medical, surgical, and social history.   Review of Systems Pertinent positives and negatives in the history of present illness.     Objective:   Physical Exam  Constitutional: She is oriented to person, place, and time. She appears well-developed and well-nourished. No distress.  Eyes: Conjunctivae and EOM are normal. Pupils are equal, round, and reactive to light.  Neck: Normal range of motion. Neck supple.  Cardiovascular: Normal rate, regular rhythm, normal heart sounds and intact distal pulses.   Pulmonary/Chest: Effort normal and breath sounds normal. She exhibits tenderness.    Lymphadenopathy:    She has no cervical adenopathy.  Neurological: She is alert and oriented to person, place, and time. No cranial nerve deficit. Coordination normal.  Skin: Skin is warm and dry. No pallor.  Psychiatric: Her speech is normal and behavior is normal. Thought content normal. She exhibits a depressed mood. She expresses no suicidal ideation.   BP 128/82   Pulse 76   Resp 16   Wt 142 lb (64.4  kg)   LMP 06/30/2016   SpO2 99%   BMI 22.92 kg/m       Assessment & Plan:  Situational mixed anxiety and depressive disorder  Test anxiety  Chest wall pain  Discussed that chest wall pain with movement suggests a musculoskeletal etiology and her negative cardiac workup in the ED confirms this. She will continue taking Tylenol for chest wall pain and Tramadol as needed for severe pain.  Recommend she stop drinking wine and have a clear mind for her upcoming test.     Discussed that I am concerned about her anxiety and depression that seem to be directly related to her inability to prepare and pass the cosmetology exam. I am suspicious that she may have ADD or possibly a learning disability.   Plan to refer her to [facility] for formal testing.  Discussed setting a daily alarm to help her remember to take her Xarelto. She verbalized understanding of the importance of taking this, to prevent blood clots.  She has an upcoming appointment with neurology.  Will follow up as needed if symptoms are not improving or if she worsens.

## 2016-07-24 NOTE — Patient Instructions (Signed)
Take Tylenol for chest wall pain. If the pain is severe you can take Tramadol.   Call and schedule an appointment with [facility] for testing for ADHD and learning disability.  [address] [place]  [phone]    Chest Wall Pain Chest wall pain is pain in or around the bones and muscles of your chest. Sometimes, an injury causes this pain. Sometimes, the cause may not be known. This pain may take several weeks or longer to get better. Follow these instructions at home: Pay attention to any changes in your symptoms. Take these actions to help with your pain:  Rest as told by your health care provider.  Avoid activities that cause pain. These include any activities that use your chest muscles or your abdominal and side muscles to lift heavy items.  If directed, apply ice to the painful area:  Put ice in a plastic bag.  Place a towel between your skin and the bag.  Leave the ice on for 20 minutes, 2-3 times per day.  Take over-the-counter and prescription medicines only as told by your health care provider.  Do not use tobacco products, including cigarettes, chewing tobacco, and e-cigarettes. If you need help quitting, ask your health care provider.  Keep all follow-up visits as told by your health care provider. This is important. Contact a health care provider if:  You have a fever.  Your chest pain becomes worse.  You have new symptoms. Get help right away if:  You have nausea or vomiting.  You feel sweaty or light-headed.  You have a cough with phlegm (sputum) or you cough up blood.  You develop shortness of breath. This information is not intended to replace advice given to you by your health care provider. Make sure you discuss any questions you have with your health care provider. Document Released: 04/17/2005 Document Revised: 08/26/2015 Document Reviewed: 07/13/2014 Elsevier Interactive Patient Education  2017 [employer]

## 2016-07-26 ENCOUNTER — Telehealth: Payer: Self-pay | Admitting: Internal Medicine

## 2016-07-26 MED ORDER — ERYTHROMYCIN 5 MG/GM OP OINT: TOPICAL_OINTMENT | OPHTHALMIC | 0 refills | Status: DC

## 2016-07-26 MED FILL — VIT D2 1.25 MG (50,000 UNIT: 1.25 MG | 56 days supply | Qty: 8 | Fill #0

## 2016-07-26 MED FILL — ERYTHROMYCIN EYE OINTMENT: 5 | 10 days supply | Qty: 4 | Fill #0

## 2016-07-26 MED FILL — traMADol HCL 50 MG TABS: 50 | 10 days supply | Qty: 30 | Fill #0

## 2016-07-26 NOTE — Addendum Note (Signed)
Addended by: Herminio CommonsJOHNSON, Bilan Tedesco A on: 07/26/2016 10:49 AM   Modules accepted: Orders

## 2016-07-26 NOTE — Telephone Encounter (Signed)
Pt called and left a voicemail stating that her eye was swollen on the bottom and thinks its a stye and wanted to know what else she could do other than warm compresses.

## 2016-07-26 NOTE — Telephone Encounter (Signed)
Done. Pt was notified and it was lower right eyelid

## 2016-07-26 NOTE — Telephone Encounter (Signed)
Please let her know that we will send in an ointment to put on the stye. Continue using warm compresses. I recall that it was on her right lower eyelid? If it is not improving in 2-3 days then she should let me know.  Please send in: Erythromycin opth Ointment 0.5% Apply 1 cm ribbon on her eyelid 4 times per day for 7 days.

## 2016-08-17 ENCOUNTER — Ambulatory Visit (HOSPITAL_BASED_OUTPATIENT_CLINIC_OR_DEPARTMENT_OTHER): Payer: 59 | Admitting: Oncology

## 2016-08-17 VITALS — BP 103/65 | HR 72 | Temp 97.9°F | Resp 18 | Ht 66.0 in | Wt 141.8 lb

## 2016-08-17 DIAGNOSIS — I82409 Acute embolism and thrombosis of unspecified deep veins of unspecified lower extremity: Secondary | ICD-10-CM

## 2016-08-17 NOTE — Progress Notes (Signed)
Hematology and Oncology Follow Up Visit  Tiffany [name] [id number] 02/20/1986 30 y.o. 08/17/2016 4:15 PM Tiffany Suezanne Jacquet, NP-CHenson, Tiffany L, NP-C   Principle Diagnosis: 31 year old woman with history of multiple episodes of lower extremity deep vein thrombosis her first episode was in 2007 coinciding with her pregnancy state. Her second episode was in 2013 associated with pregnancy as well although it was unclear whether she had a chronic blood clot. Her most recent Dopplers in September of 2014 showed chronic deep vein thrombosis as well.   Current therapy: She is currently on Xarelto since September of 2014.  Interim History: Ms. Dougan presents today for a followup visit. Since her last visit, she reports no major changes in her health. She continues to take Xarelto without complications. She has not reported any recent thrombosis or bleeding. She does report lower extremity swelling on the left side associated with her previous deep vein thrombosis. She was seen in the emergency department in March 2018 for chest pain any CT scan showed no PE. She had difficulty standing for an extended period of time because of her swelling in the legs. Elevating her feet usually helped his symptoms.   She does not report any headaches or blurry vision or syncope. Does not report any seizure or change in her mood. No fevers or or chills. She does not report any chest pain orthopnea or PND. Sample of any cough or hemoptysis she does report occasional wheezing. She does not report any nausea or vomiting or abdominal pain. Does not report any frequency urgency or hesitancy. She does not report any vaginal bleeding or hematuria. Rest of her review of systems unremarkable.  Medications: I have reviewed the patient's current medications.  Current Outpatient Prescriptions  Medication Sig Dispense Refill  . acetaminophen (TYLENOL) 500 MG tablet Take 1 tablet (500 mg total) by mouth every 6 (six) hours as needed.  30 tablet 0  . albuterol (PROVENTIL HFA;VENTOLIN HFA) 108 (90 Base) MCG/ACT inhaler Inhale 2 puffs into the lungs every 6 (six) hours as needed for wheezing or shortness of breath. 1 Inhaler 1  . erythromycin (ROMYCIN) ophthalmic ointment Apply 1 cm ribbon to lower eyelid 4 times a day. 3.5 g 0  . Iron Combinations (IRON COMPLEX PO) Take 10 mg by mouth daily.    Marland Kitchen LORazepam (ATIVAN) 1 MG tablet Take 0.5 tablets (0.5 mg total) by mouth every 6 (six) hours as needed for anxiety. 5 tablet 0  . Multiple Vitamin (MULTIVITAMIN) tablet Take 1 tablet by mouth daily.    . Rivaroxaban (XARELTO) 20 MG TABS tablet Take 20 mg by mouth daily after lunch.     . traMADol (ULTRAM) 50 MG tablet Take 1 tablet (50 mg total) by mouth every 8 (eight) hours as needed. 30 tablet 0  . Vitamin D, Ergocalciferol, (DRISDOL) 50000 units CAPS capsule Take 1 capsule (50,000 Units total) by mouth every 7 (seven) days. 8 capsule 0   No current facility-administered medications for this visit.      Allergies: No Known Allergies  Past Medical History, Surgical history, Social history, and Family History were reviewed and updated.  Physical Exam: Blood pressure 103/65, pulse 72, temperature 97.9 F (36.6 C), temperature source Oral, resp. rate 18, height  (1.676 m), weight 141 lb 12.8 oz (64.3 kg), SpO2 100 %. ECOG: 0 General appearance: Alert, awake woman without distress. Head: Normocephalic, without obvious abnormality.  Neck: no adenopathy Lymph nodes: Cervical, supraclavicular, and axillary nodes normal. Heart:regular rate and rhythm, S1,  S2 normal, no murmur, click, rub or gallop Lung:chest clear, no wheezing, rales, normal symmetric air entry Abdomin: soft, non-tender, without masses or organomegaly no shifting dullness or ascites. EXT:no erythema, induration, or nodules   Lab Results: Lab Results  Component Value Date   WBC 9.5 07/22/2016   HGB 12.5 07/22/2016   HCT 37.6 07/22/2016   MCV 90.0  07/22/2016   PLT 364 07/22/2016     Chemistry      Component Value Date/Time   NA 139 07/22/2016 0750   K 4.2 07/22/2016 0750   CL 106 07/22/2016 0750   CO2 23 07/22/2016 0750   BUN 9 07/22/2016 0750   CREATININE 0.93 07/22/2016 0750   CREATININE 0.83 07/20/2016 0950      Component Value Date/Time   CALCIUM 9.9 07/22/2016 0750   ALKPHOS 32 (L) 07/22/2016 0750   AST 25 07/22/2016 0750   ALT 16 07/22/2016 0750   BILITOT 0.5 07/22/2016 0750        Impression and Plan:  31 year old woman with:  1.  History of a deep vein thrombosis potentially with multiple episodes involving the left lower extremity. Her first episode was in 2007 after giving birth for her second child. Since that time, she in treated with Lovenox during 2 other subsequent pregnancies.   Her hypercoagulable workup was unremarkable. Repeat ultrasound Doppler of February 2016 showed persistent chronic deep vein thrombosis.  She is currently on Xarelto and continues to tolerated it well. Risks and benefits of continuing this medication were reviewed today and she is agreeable to continue. She feels protected and would like to continue on this current medication.  2. Left lower extremity swelling: Related to her previous TPN thrombosis and appears to have resolved.  3. Follow-up: Will be as needed in the future.  [facility], MD 4/19/20184:15 PM

## 2016-09-11 ENCOUNTER — Telehealth: Payer: Self-pay

## 2016-09-11 NOTE — Telephone Encounter (Signed)
[facility] [place] [facility] [phone] per pt signed release. Trixie Rude/RLB

## 2016-09-22 ENCOUNTER — Encounter: Payer: Self-pay | Admitting: Neurology

## 2016-09-22 ENCOUNTER — Ambulatory Visit (INDEPENDENT_AMBULATORY_CARE_PROVIDER_SITE_OTHER): Payer: Self-pay | Admitting: Neurology

## 2016-09-22 VITALS — BP 110/64 | HR 63 | Ht 66.0 in | Wt 139.0 lb

## 2016-09-22 DIAGNOSIS — R413 Other amnesia: Secondary | ICD-10-CM

## 2016-09-22 DIAGNOSIS — Z0279 Encounter for issue of other medical certificate: Secondary | ICD-10-CM

## 2016-09-22 DIAGNOSIS — R404 Transient alteration of awareness: Secondary | ICD-10-CM

## 2016-09-22 DIAGNOSIS — G629 Polyneuropathy, unspecified: Secondary | ICD-10-CM

## 2016-09-22 NOTE — Progress Notes (Signed)
NEUROLOGY CONSULTATION NOTE  Tiffany [name] MRN: [id number] DOB: March 03, 1986  Referring provider: Hetty Blend, NP-C Primary care provider: Hetty Blend, NP-C  Reason for consult:  Memory loss, paresthesias  Thank you for your kind referral of Tiffany [name] for consultation of the above symptoms. Although her history is well known to you, please allow me to reiterate it for the purpose of our medical record. Records and images were personally reviewed where available.  HISTORY OF PRESENT ILLNESS: This is a pleasant 31 year old right-handed woman with a history of DVT on Xarelto, positive lupus anticoagulant, presenting for evaluation of memory changes and paresthesias. She started noticing problems after she enrolled at [employer] in August 2015. She was having problems remembering the lessons, and has had difficulty passing her exit exam, failing it repeatedly because she cannot seem to remember the questions. She reports graduating but not finishing the exam. Grades were average, she was able to focus in the classroom, but would have difficulties taking tests. She noticed some focusing problems in elementary and high school but graduated in regular classes. She lives with her 4 children, and last month forgot to pick up one of her children. She was distracted taking 2 of them to a doctor appointment then back to school. She picked 2 of them up but forgot the other one. She forgets doctor appointments even if she puts a reminder in her phone 2 days prior. She frequently has to reschedule. She forgets her medications, her husband has to remind her. He is in charge of bill payments. She reports she has always gotten lost, even with maps. No family history of dementia. She had a head injury at age 38 while playing, no neurosurgical procedures done. She drinks wine on the weekends. She had an MMSE at her PCP office in March 2018 which reported a score of 24/29.  Over the past 3 months, she has  started sleepwalking. Her husband would tell her she got up to the bathroom 3 times in a row. Two weeks later, she did the same thing. She has no recollection of this. She apparently has walked around the house and then she would come back to bed and sleep. She reports daydreaming a lot. She has phantosmia of smelling syrup 4 times a day for a couple of seconds, last episode was 2 months ago. She has occasional nausea with reflux. She reports chronic constant numbness in her hands and feet since 2007. She used to take a medication to help with the numbness, which was stopped by her new PCP. For the past 1-2 years, she has had intermittent mid-frontal throbbing headaches occurring on a daily basis, at times multiple times a day. She takes Tylenol on a daily basis. No associated nausea/vomiting/photosensitivity. SHe has dizziness for a couple of minutes even with sitting. She has some back pain. No diplopia, dysarthria/dysphagia, neck pain. She has been referred for evaluation of ADD but has not done it yet. 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 She denies any olfactory/gustatory hallucinations, deja vu, rising epigastric sensation, myoclonic jerks.She had a normal birth and early development.  There is no history of febrile convulsions, CNS infections such as meningitis/encephalitis, significant traumatic brain injury, neurosurgical procedures, or family history of seizures.  PAST MEDICAL HISTORY: Past Medical History:  Diagnosis Date  . Anemia 2007   HGB 11.04 Feb 2009.  Marland Kitchen Asthma   . Deep vein thrombosis (DVT) (HCC)  left leg  . Hernia, umbilical    plans for repair soon  . 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 Missed abortion 1/10   SP D&C  . Preterm labor   . SAB (spontaneous abortion) 1/09    PAST SURGICAL HISTORY: Past Surgical History:  Procedure Laterality Date  . CESAREAN SECTION  [id number],2007  . CESAREAN SECTION   11/20/2010   Procedure: CESAREAN SECTION;  Surgeon: Brock Badharles A Harper, MD;  Location: [facility];  Service: Gynecology;  Laterality: N/A;  repeat cesarean section   . CESAREAN SECTION  11/15/2011   Procedure: CESAREAN SECTION;  Surgeon: Kathreen CosierBernard A Marshall, MD;  Location: [facility];  Service: Gynecology;  Laterality: N/A;  . DILATION AND CURETTAGE OF UTERUS  1/10   For missed AB  . HYSTEROSCOPY W/D&C N/A 10/15/2013   Procedure: HYSTEROSCOPY/DILATATION AND CURETTAGE;  Surgeon: Kathreen CosierBernard A Marshall, MD;  Location: [facility];  Service: Gynecology;  Laterality: N/A;  . TONSILLECTOMY      MEDICATIONS: Current Outpatient Prescriptions on File Prior to Visit  Medication Sig Dispense Refill  . acetaminophen (TYLENOL) 500 MG tablet Take 1 tablet (500 mg total) by mouth every 6 (six) hours as needed. 30 tablet 0  . albuterol (PROVENTIL HFA;VENTOLIN HFA) 108 (90 Base) MCG/ACT inhaler Inhale 2 puffs into the lungs every 6 (six) hours as needed for wheezing or shortness of breath. 1 Inhaler 1  . erythromycin (ROMYCIN) ophthalmic ointment Apply 1 cm ribbon to lower eyelid 4 times a day. 3.5 g 0  . Iron Combinations (IRON COMPLEX PO) Take 10 mg by mouth daily.    Marland Kitchen. LORazepam (ATIVAN) 1 MG tablet Take 0.5 tablets (0.5 mg total) by mouth every 6 (six) hours as needed for anxiety. 5 tablet 0  . Multiple Vitamin (MULTIVITAMIN) tablet Take 1 tablet by mouth daily.    . Rivaroxaban (XARELTO) 20 MG TABS tablet Take 20 mg by mouth daily after lunch.     . traMADol (ULTRAM) 50 MG tablet Take 1 tablet (50 mg total) by mouth every 8 (eight) hours as needed. 30 tablet 0  . Vitamin D, Ergocalciferol, (DRISDOL) 50000 units CAPS capsule Take 1 capsule (50,000 Units total) by mouth every 7 (seven) days. 8 capsule 0   No current facility-administered medications on file prior to visit.     ALLERGIES: No Known Allergies  FAMILY HISTORY: Family History  Problem Relation Age of Onset  . Hyperlipidemia Mother   . Hypertension Mother     . Arthritis Mother   . Hypertension Father   . Hyperlipidemia Father   . Diabetes Father   . Stroke Father   . Cataracts Father   . Other Neg Hx     SOCIAL HISTORY: Social History   Social History  . Marital status: Married    Spouse name: N/A  . Number of children: N/A  . Years of education: N/A   Occupational History  . CNA    Social History Main Topics  . Smoking status: Never Smoker  . Smokeless tobacco: Never Used  . Alcohol use No  . Drug use: No  . Sexual activity: Yes    Birth control/ protection: None   Other Topics Concern  . Not on file   Social History Narrative   Works as a LawyerCNA, married, gets regular exercise, [place]/kids.     REVIEW OF SYSTEMS: Constitutional: No fevers, chills, or sweats, no generalized fatigue, change in appetite Eyes: No visual changes, double vision,  eye pain Ear, nose and throat: No hearing loss, ear pain, nasal congestion, sore throat Cardiovascular: No chest pain, palpitations Respiratory:  No shortness of breath at rest or with exertion, wheezes GastrointestinaI: No nausea, vomiting, diarrhea, abdominal pain, fecal incontinence Genitourinary:  No dysuria, urinary retention or frequency Musculoskeletal:  No neck pain,+ back pain Integumentary: No rash, pruritus, skin lesions Neurological: as above Psychiatric: No depression, insomnia, anxiety Endocrine: No palpitations, fatigue, diaphoresis, mood swings, change in appetite, change in weight, increased thirst Hematologic/Lymphatic:  No anemia, purpura, petechiae. Allergic/Immunologic: no itchy/runny eyes, nasal congestion, recent allergic reactions, rashes  PHYSICAL EXAM: Vitals:   09/22/16 1012  BP: 110/64  Pulse: 63   General: No acute distress Head:  Normocephalic/atraumatic Eyes: Fundoscopic exam shows bilateral sharp discs, no vessel changes, exudates, or hemorrhages Neck: supple, no paraspinal tenderness, full range of motion Back: No  paraspinal tenderness Heart: regular rate and rhythm Lungs: Clear to auscultation bilaterally. Vascular: No carotid bruits. Skin/Extremities: No rash, no edema Neurological Exam: Mental status: alert and oriented to person, place, and time, no dysarthria or aphasia, Fund of knowledge is appropriate.  Recent and remote memory are intact.  Attention and concentration are normal.    Able to name objects and repeat phrases. CDT 4/5  MMSE - Mini Mental State Exam 09/22/2016  Orientation to time 5  Orientation to Place 5  Registration 3  Attention/ Calculation 5  Recall 1  Language- name 2 objects 2  Language- repeat 1  Language- follow 3 step command 3  Language- read & follow direction 1  Write a sentence 1  Copy design 1  Total score 28   Cranial nerves: CN I: not tested CN II: pupils equal, round and reactive to light, visual fields intact, fundi unremarkable. CN III, IV, VI:  full range of motion, no nystagmus, no ptosis CN V: facial sensation intact CN VII: upper and lower face symmetric CN VIII: hearing intact to finger rub CN IX, X: gag intact, uvula midline CN XI: sternocleidomastoid and trapezius muscles intact CN XII: tongue midline Bulk & Tone: normal, no fasciculations. Motor: 5/5 throughout with no pronator drift. Sensation: intact to light touch, cold, pin on both UE, decreased cold on right calf, decreased vibration to ankles bilaterally, intact pin. No extinction to double simultaneous stimulation.  Romberg test negative Deep Tendon Reflexes: brisk +2 throughout, no ankle clonus, negative Hoffman sign Plantar responses: downgoing bilaterally Cerebellar: no incoordination on finger to nose, heel to shin. No dysdiadochokinesia Gait: narrow-based and steady, able to tandem walk adequately. Tremor: none  IMPRESSION: This is a pleasant 31 year old right-handed woman with a history of DVT on Xarelto, positive lupus anticoagulant, presenting with memory loss and  paresthesias. Her MMSE today is normal 28/30. Neurological exam shows brisk reflexes and subjective sensory changes in a stocking distribution. The etiology of her symptoms is unclear, MRI brain with and without contrast will be ordered to assess for underlying structural abnormality. She reports new onset sleepwalking and "daydreaming," subclinical seizures are considered, 1-hour sleep-deprived EEG will be done. She will be scheduled for an EMG of the right UE and LE to further evaluate sensory complaints. If testing is normal, we will plan for Neurocognitive evaluation. She will follow-up in 5 months and knows to call for any changes.   Thank you for allowing me to participate in the care of this patient. Please do not hesitate to call for any questions or concerns.   Patrcia Dolly, M.D.  CC: Hetty Blend, NP

## 2016-09-22 NOTE — Patient Instructions (Addendum)
Once insurance in place, call us so we can schedule the following:  1. Schedule MRI brain with and without contrast 2. Schedule 1-hour sleep-deprived EEG 3. Schedule EMG/NCV of right UE and LE with Dr. Allena KatzPatel 4. Our office will call you with results, if normal, we will plan for Neurocognitive testing with Dr. Alinda DoomsBailar 5. Follow-up in October 2018

## 2016-12-04 ENCOUNTER — Encounter: Payer: Self-pay | Admitting: Oncology

## 2016-12-05 ENCOUNTER — Encounter: Payer: Self-pay | Admitting: *Deleted

## 2017-01-29 ENCOUNTER — Encounter: Payer: Self-pay | Admitting: Neurology

## 2017-01-29 ENCOUNTER — Ambulatory Visit: Payer: Self-pay | Admitting: Neurology

## 2017-01-29 DIAGNOSIS — Z029 Encounter for administrative examinations, unspecified: Secondary | ICD-10-CM

## 2017-02-01 ENCOUNTER — Encounter: Payer: Self-pay | Admitting: Neurology

## 2017-02-01 ENCOUNTER — Ambulatory Visit (INDEPENDENT_AMBULATORY_CARE_PROVIDER_SITE_OTHER): Payer: BLUE CROSS/BLUE SHIELD | Admitting: Neurology

## 2017-02-01 VITALS — BP 100/74 | HR 95 | Ht 66.0 in | Wt 138.0 lb

## 2017-02-01 DIAGNOSIS — R413 Other amnesia: Secondary | ICD-10-CM | POA: Diagnosis not present

## 2017-02-01 DIAGNOSIS — G629 Polyneuropathy, unspecified: Secondary | ICD-10-CM | POA: Diagnosis not present

## 2017-02-01 DIAGNOSIS — R404 Transient alteration of awareness: Secondary | ICD-10-CM
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
## 2017-02-01 NOTE — Patient Instructions (Addendum)
1. Schedule MRI brain with and without contrast  We have sent a referral to [facility] for your MRI and they will call you directly to schedule your appt. They are located at [address] [facility]. If you need to contact them directly please call [phone].   2. Schedule 1-hour sleep-deprived EEG 3. Schedule EMG/NCV of the right UE and LE 4. Our office will call you with results, if the tests come back okay, we will schedule you for memory testing and follow-up after

## 2017-02-12 ENCOUNTER — Other Ambulatory Visit: Payer: Self-pay

## 2017-02-23 ENCOUNTER — Ambulatory Visit
Admission: RE | Admit: 2017-02-23 | Discharge: 2017-02-23 | Disposition: A | Discharge status: Home / Self Care | Payer: BLUE CROSS/BLUE SHIELD | Source: Ambulatory Visit | Attending: Neurology | Admitting: Neurology

## 2017-02-23 DIAGNOSIS — R413 Other amnesia: Secondary | ICD-10-CM

## 2017-02-23 DIAGNOSIS — G629 Polyneuropathy, unspecified: Secondary | ICD-10-CM

## 2017-02-23 DIAGNOSIS — R404 Transient alteration of awareness: Secondary | ICD-10-CM

## 2017-02-23 MED ORDER — GADOBENATE DIMEGLUMINE 529 MG/ML IV SOLN
13.0000 mL | Freq: Once | INTRAVENOUS | Status: AC | PRN
  Administered 2017-02-23: 13 mL via INTRAVENOUS

## 2017-02-28 ENCOUNTER — Telehealth: Payer: Self-pay

## 2017-02-28 NOTE — Telephone Encounter (Signed)
-----   Message from Van ClinesKaren M Aquino, MD sent at 02/27/2017  3:59 PM EDT ----- Please let patient know I reviewed MRI brain and it is normal, thanks

## 2017-02-28 NOTE — Telephone Encounter (Signed)
LMOM relaying message below.  

## 2017-04-13 ENCOUNTER — Other Ambulatory Visit: Payer: Self-pay | Admitting: Family Medicine

## 2017-04-13 DIAGNOSIS — E559 Vitamin D deficiency, unspecified: Secondary | ICD-10-CM

## 2017-04-16 MED ORDER — VITAMIN D (ERGOCALCIFEROL) 1.25 MG (50000 UNIT) PO CAPS: 50000.0000 [IU] | ORAL_CAPSULE | ORAL | 0 refills | Status: DC

## 2017-04-16 NOTE — Telephone Encounter (Signed)
Please find out why she is needing the Tramadol. She has been seen for memory issues by neurology so I would like to avoid this if possible.

## 2017-04-16 NOTE — Telephone Encounter (Signed)
Can pt have  Refill on meds

## 2017-04-17 NOTE — Telephone Encounter (Signed)
Do not refill.  

## 2017-04-17 NOTE — Telephone Encounter (Signed)
Is this ok to refill?  

## 2017-05-08 ENCOUNTER — Encounter: Payer: Self-pay | Admitting: Oncology

## 2018-06-27 ENCOUNTER — Telehealth: Payer: Self-pay | Admitting: Family Medicine

## 2018-06-27 NOTE — Telephone Encounter (Signed)
Dismissal letter in guarantor snapshot  °

## 2018-12-04 ENCOUNTER — Telehealth (INDEPENDENT_AMBULATORY_CARE_PROVIDER_SITE_OTHER): Payer: Managed Care, Other (non HMO) | Admitting: Primary Care

## 2018-12-04 DIAGNOSIS — Z7689 Persons encountering health services in other specified circumstances: Secondary | ICD-10-CM | POA: Diagnosis not present

## 2018-12-04 DIAGNOSIS — I82401 Acute embolism and thrombosis of unspecified deep veins of right lower extremity: Secondary | ICD-10-CM

## 2018-12-04 NOTE — Progress Notes (Signed)
Virtual Visit via Telephone Note  I connected with Tiffany [name] on 12/04/18 at  1:50 PM EDT by telephone and verified that I am speaking with the correct person using two identifiers.   I discussed the limitations, risks, security and privacy concerns of performing an evaluation and management service by telephone and the availability of in person appointments. I also discussed with the patient that there may be a patient responsible charge related to this service. The patient expressed understanding and agreed to proceed.  Via Web History of Present Illness: [name] is being seen for she has a DVT in her left leg for 13 years in calf area. Previously on Xeralto and off for a year. Advised to have doppler performed. Past Medical History:  Diagnosis Date  . Anemia 2007   HGB 11.04 Feb 2009.  Marland Kitchen Asthma   . Deep vein thrombosis (DVT) (HCC)    left leg  . Hernia, umbilical    plans for repair soon  . Lupus anticoagulant disorder ([place]) 11/07   Detected  . Missed abortion 1/10   SP D&C  . Preterm labor   . SAB (spontaneous abortion) 1/09     Observations/Objective: Review of Systems  Cardiovascular: Positive for leg swelling.  Musculoskeletal:       Pain in right leg near ankle   Skin:       Right leg is bruising     Assessment and Plan: Diagnoses and all orders for this visit:  Encounter to establish care  Acute thromboembolism of deep veins of right lower extremity ([place]) We discussed current management recommendations for unprovoked venous thromboemboli. Most experts would recommend long-term anticoagulation with periodic reevaluation and continuation of anticoagulation as long as ongoing bleeding risk was low. Recent anticoagulation guidelines published in the journal Chest in December 2015, now recommend the anticoagulant of choice for long-term anticoagulation as one of the new target specific oral anticoagulants in preference to warfarin due to the decrease  bleeding risk with the new drugs. There is one provocative study in the literature which needs to be reproduced. In this study after a period of initial full dose anticoagulation, patients were randomized in 3 groups to receive either observation alone, full dose apixiban and, or 50% dose reduced apixiban (prophylactic dose 2.5 mg twice daily). This confirmed the ongoing risk for patients stop anticoagulation but probably more importantly showed that anticoagulation efficacy could be maintained with a lower dose without significant bleeding complications. I think this would be a very reasonable option for somebody like this man but for the time being I have recommended that he continue full dose Xarelto to -     Cancel: VAS [place] LOWER EXTREMITY VENOUS (DVT); Future    Follow Up Instructions:    I discussed the assessment and treatment plan with the patient. The patient was provided an opportunity to ask questions and all were answered. The patient agreed with the plan and demonstrated an understanding of the instructions.   The patient was advised to call back or seek an in-person evaluation if the symptoms worsen or if the condition fails to improve as anticipated.  I provided 17 minutes of non-face-to-face time during this encounter.   Kerin Perna, NP

## 2018-12-05 ENCOUNTER — Other Ambulatory Visit: Payer: Self-pay

## 2018-12-05 ENCOUNTER — Ambulatory Visit (HOSPITAL_COMMUNITY): Admission: RE | Admit: 2018-12-05 | Payer: Managed Care, Other (non HMO) | Source: Ambulatory Visit

## 2018-12-05 ENCOUNTER — Ambulatory Visit (HOSPITAL_COMMUNITY): Payer: Managed Care, Other (non HMO)

## 2018-12-05 ENCOUNTER — Other Ambulatory Visit (INDEPENDENT_AMBULATORY_CARE_PROVIDER_SITE_OTHER): Payer: Self-pay | Admitting: Primary Care

## 2018-12-05 ENCOUNTER — Ambulatory Visit (HOSPITAL_COMMUNITY)
Admission: RE | Admit: 2018-12-05 | Discharge: 2018-12-05 | Disposition: A | Discharge status: Home / Self Care | Payer: Managed Care, Other (non HMO) | Source: Ambulatory Visit | Attending: Primary Care | Admitting: Primary Care

## 2018-12-05 DIAGNOSIS — I82401 Acute embolism and thrombosis of unspecified deep veins of right lower extremity: Secondary | ICD-10-CM | POA: Diagnosis present

## 2018-12-05 DIAGNOSIS — I82402 Acute embolism and thrombosis of unspecified deep veins of left lower extremity: Secondary | ICD-10-CM

## 2018-12-05 NOTE — Progress Notes (Signed)
Right lower extremity venous duplex completed. Preliminary results in Chart review CV Proc. [facility], [place] 12/05/2018, 5:14 PM

## 2018-12-06 ENCOUNTER — Telehealth (INDEPENDENT_AMBULATORY_CARE_PROVIDER_SITE_OTHER): Payer: Self-pay

## 2018-12-06 ENCOUNTER — Other Ambulatory Visit (INDEPENDENT_AMBULATORY_CARE_PROVIDER_SITE_OTHER): Payer: Self-pay | Admitting: Primary Care

## 2018-12-06 MED ORDER — RIVAROXABAN 20 MG PO TABS: 20.0000 mg | ORAL_TABLET | Freq: Every day | ORAL | 30 refills | Status: DC

## 2018-12-06 MED FILL — XARELTO 20 MG TABLET: 20 | 30 days supply | Qty: 30 | Fill #0

## 2018-12-06 NOTE — Telephone Encounter (Signed)
-----   Message from Kerin Perna, NP sent at 12/06/2018 10:53 AM EDT ----- Tiffany [name] in

## 2018-12-06 NOTE — Telephone Encounter (Signed)
Patient verified name and date of birth. She was the informed that Xarelto has been sent to [facility]. Nat Christen, CMA

## 2019-01-02 ENCOUNTER — Other Ambulatory Visit: Payer: Self-pay

## 2019-01-02 ENCOUNTER — Ambulatory Visit: Payer: Managed Care, Other (non HMO) | Admitting: Obstetrics and Gynecology

## 2019-01-02 ENCOUNTER — Encounter: Payer: Self-pay | Admitting: Obstetrics and Gynecology

## 2019-01-02 VITALS — BP 126/88 | HR 80 | Temp 98.2°F | Ht 66.0 in | Wt 160.0 lb

## 2019-01-02 DIAGNOSIS — N898 Other specified noninflammatory disorders of vagina: Secondary | ICD-10-CM

## 2019-01-02 DIAGNOSIS — F4321 Adjustment disorder with depressed mood: Secondary | ICD-10-CM

## 2019-01-02 DIAGNOSIS — B9689 Other specified bacterial agents as the cause of diseases classified elsewhere: Secondary | ICD-10-CM | POA: Diagnosis not present

## 2019-01-02 DIAGNOSIS — Z01419 Encounter for gynecological examination (general) (routine) without abnormal findings: Secondary | ICD-10-CM

## 2019-01-02 DIAGNOSIS — Z124 Encounter for screening for malignant neoplasm of cervix: Secondary | ICD-10-CM

## 2019-01-02 DIAGNOSIS — Z1151 Encounter for screening for human papillomavirus (HPV): Secondary | ICD-10-CM

## 2019-01-02 DIAGNOSIS — Z113 Encounter for screening for infections with a predominantly sexual mode of transmission: Secondary | ICD-10-CM

## 2019-01-02 DIAGNOSIS — N760 Acute vaginitis: Secondary | ICD-10-CM

## 2019-01-02 NOTE — Progress Notes (Signed)
GYNECOLOGY CLINIC ANNUAL PREVENTATIVE CARE ENCOUNTER NOTE  Subjective:   Tiffany [name] is a 33 y.o. [phone] married, AA female here for a routine annual gynecologic exam.  Current complaints: random hot & cold flashes, feeling "very overwhelmed" with working FT job, going to school FT, managing remote learning with all 4 kids, and stressors at work.  She states she is very anxious about having her pap today, because she only trusted Dr. Gaynell Face for years and was afraid to go to anyone else. H/O DVT x 13 years, takin Zarelto as prescribed - managed by Westley Hummer, NP. Denies abnormal vaginal bleeding, discharge, pelvic pain, problems with intercourse or other gynecologic concerns.    Gynecologic History Patient's last menstrual period was 12/28/2018 (exact date). Contraception: tubal ligation done by Dr. Gaynell Face 11/15/2011 Last Pap: 2013. Results were: normal per pt -- unable to locate results in Epic Last mammogram: n/a. Results were: n/a  Obstetric History OB History  Gravida Para Term Preterm AB Living  8 4 2 2 4 4   SAB TAB Ectopic Multiple Live Births  4       2    # Outcome Date GA Lbr Len/2nd Weight Sex Delivery Anes PTL Lv  8 Preterm 11/15/11 [redacted]w[redacted]d  6 lb 3.7 oz (2.825 kg) M CS-LTranv Spinal  LIV  7 Preterm 11/20/10 [redacted]w[redacted]d  6 lb 14.6 oz (3.135 kg) M CS-LTranv Spinal  LIV     Birth Comments: wnl  6 SAB 2009          5 Term 2007     CS-LTranv        Birth Comments: repeat  4 Term 2004     CS-LTranv        Birth Comments: Breech  3 SAB           2 SAB           1 SAB              Birth Comments: Blighted Ovum    Past Medical History:  Diagnosis Date  . Anemia 2007   HGB 11.04 Feb 2009.  Marland Kitchen Asthma   . Deep vein thrombosis (DVT) (HCC)    left leg  . Hernia, umbilical    plans for repair soon  . 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 Missed abortion 1/10   SP D&C  . Preterm labor   . SAB (spontaneous abortion) 1/09    Past Surgical History:   Procedure Laterality Date  . CESAREAN SECTION  [id number]  . CESAREAN SECTION  11/20/2010   Procedure: CESAREAN SECTION;  Surgeon: Brock Bad, MD;  Location: [facility];  Service: Gynecology;  Laterality: N/A;  repeat cesarean section   . CESAREAN SECTION  11/15/2011   Procedure: CESAREAN SECTION;  Surgeon: Kathreen Cosier, MD;  Location: [facility];  Service: Gynecology;  Laterality: N/A;  . DILATION AND CURETTAGE OF UTERUS  1/10   For missed AB  . HYSTEROSCOPY W/D&C N/A 10/15/2013   Procedure: HYSTEROSCOPY/DILATATION AND CURETTAGE;  Surgeon: Kathreen Cosier, MD;  Location: [facility];  Service: Gynecology;  Laterality: N/A;  . TONSILLECTOMY      Current Outpatient Medications on File Prior to Visit  Medication Sig Dispense Refill  . acetaminophen (TYLENOL) 500 MG tablet Take 1 tablet (500 mg total) by mouth every 6 (six) hours as needed. 30 tablet 0  . albuterol (PROVENTIL HFA;VENTOLIN HFA) 108 (90 Base) MCG/ACT inhaler Inhale 2 puffs into the lungs every  6 (six) hours as needed for wheezing or shortness of breath. 1 Inhaler 1  . hydroxychloroquine (PLAQUENIL) 200 MG tablet TAKE 1 TABLET BY MOUTH TWICE DAILY WITH FOOD OR MILK  2  . Iron Combinations (IRON COMPLEX PO) Take 10 mg by mouth daily.    Marland Kitchen LORazepam (ATIVAN) 1 MG tablet Take 0.5 tablets (0.5 mg total) by mouth every 6 (six) hours as needed for anxiety. 5 tablet 0  . Multiple Vitamin (MULTIVITAMIN) tablet Take 1 tablet by mouth daily.    . rivaroxaban (XARELTO) 20 MG TABS tablet Take 1 tablet (20 mg total) by mouth daily after lunch. 30 tablet 30  . traMADol (ULTRAM) 50 MG tablet Take 1 tablet (50 mg total) by mouth every 8 (eight) hours as needed. 30 tablet 0  . Vitamin D, Ergocalciferol, (DRISDOL) 50000 units CAPS capsule Take 1 capsule (50,000 Units total) by mouth every 7 (seven) days. 8 capsule 0   No current facility-administered medications on file prior to visit.     No Known Allergies  Social History   Socioeconomic  History  . Marital status: Married    Spouse name: Not on file  . Number of children: Not on file  . Years of education: Not on file  . Highest education level: Not on file  Occupational History  . Occupation: CNA  Social Needs  . Financial resource strain: Not on file  . Food insecurity    Worry: Not on file    Inability: Not on file  . Transportation needs    Medical: Not on file    Non-medical: Not on file  Tobacco Use  . Smoking status: Never Smoker  . Smokeless tobacco: Never Used  Substance and Sexual Activity  . Alcohol use: No  . Drug use: No  . Sexual activity: Yes    Birth control/protection: Surgical  Lifestyle  . Physical activity    Days per week: Not on file    Minutes per session: Not on file  . Stress: Not on file  Relationships  . Social Herbalist on phone: Not on file    Gets together: Not on file    Attends religious service: Not on file    Active member of club or organization: Not on file    Attends meetings of clubs or organizations: Not on file    Relationship status: Not on file  . Intimate partner violence    Fear of current or ex partner: Not on file    Emotionally abused: Not on file    Physically abused: Not on file    Forced sexual activity: Not on file  Other Topics Concern  . Not on file  Social History Narrative   Works as a Quarry manager, married, gets regular exercise, lives in [place] with husband/kids.     Family History  Problem Relation Age of Onset  . Hyperlipidemia Mother   . Hypertension Mother   . Arthritis Mother   . Hypertension Father   . Hyperlipidemia Father   . Diabetes Father   . Stroke Father   . Cataracts Father   . Other Neg Hx     The following portions of the patient's history were reviewed and updated as appropriate: allergies, current medications, past family history, past medical history, past social history, past surgical history and problem list.  Review of Systems Constitutional: negative Eyes:  negative Ears, nose, mouth, throat, and face: negative Respiratory: negative Cardiovascular: negative Gastrointestinal: negative Genitourinary:negative Integument/breast: negative Hematologic/lymphatic:  negative Musculoskeletal:negative Neurological: negative Behavioral/Psych: positive for depression, illegal drug usage and "feeling overwhelmed" Endocrine: negative Allergic/Immunologic: negative   Objective:  BP 126/88 (BP Location: Right Arm, Patient Position: Sitting, Cuff Size: Normal)   Pulse 80   Temp 98.2 F (36.8 C) (Oral)   Ht 5\' 6"  (1.676 m)   Wt 160 lb (72.6 kg)   LMP 12/28/2018 (Exact Date)   BMI 25.82 kg/m  CONSTITUTIONAL: Well-developed, well-nourished female in no acute distress.  HENT:  Normocephalic, atraumatic, External right and left ear normal. Oropharynx is clear and moist EYES: Conjunctivae and EOM are normal. Pupils are equal, round, and reactive to light. No scleral icterus.  NECK: Normal range of motion, supple, no masses.  Normal thyroid.  SKIN: Skin is warm and dry. No rash noted. Not diaphoretic. No erythema. No pallor. NEUROLGIC: Alert and oriented to person, place, and time. Normal reflexes, muscle tone coordination. No cranial nerve deficit noted. PSYCHIATRIC: Depressed mood. Normal affect. Normal behavior. Normal judgment and thought content. CARDIOVASCULAR: Normal heart rate noted, regular rhythm RESPIRATORY: Clear to auscultation bilaterally. Effort and breath sounds normal, no problems with respiration noted. BREASTS: Symmetric in size. No masses, skin changes, nipple drainage, or lymphadenopathy. ABDOMEN: Soft, normal bowel sounds, no distention noted.  No tenderness, rebound or guarding.  PELVIC: Normal appearing external genitalia; normal appearing vaginal mucosa and cervix.  No abnormal discharge noted.  Pap smear obtained.  Normal uterine size, no other palpable masses, no uterine or adnexal tenderness. MUSCULOSKELETAL: Normal range of  motion. No tenderness.  No cyanosis, clubbing, or edema.  2+ distal pulses.   Assessment:  Annual gynecologic examination with pap smear   Plan:   Will follow up results of pap smear and manage accordingly. Routine preventative health maintenance measures emphasized. Encouraged to f/u with her PCP to evaluate for thyroid dysfunction. [facility] - patient to make appt before leaving office today. Please refer to After Visit Summary for other counseling recommendations.   Raelyn MoraRolitta Tyasia Packard, CNM  01/02/2019 1:05 PM

## 2019-01-04 LAB — CERVICOVAGINAL ANCILLARY ONLY
Bacterial vaginitis: POSITIVE — AB
Candida vaginitis: NEGATIVE
Chlamydia: NEGATIVE
Neisseria Gonorrhea: NEGATIVE
Trichomonas: NEGATIVE

## 2019-01-04 LAB — RPR: RPR Ser Ql: REACTIVE — AB

## 2019-01-04 LAB — RPR, QUANT+TP ABS (REFLEX)
Rapid Plasma Reagin, Quant: 1:1 {titer} — ABNORMAL HIGH
T Pallidum Abs: NONREACTIVE

## 2019-01-04 LAB — HIV ANTIBODY (ROUTINE TESTING W REFLEX): HIV Screen 4th Generation wRfx: NONREACTIVE

## 2019-01-07 LAB — CYTOLOGY - PAP
Diagnosis: NEGATIVE
HPV: NOT DETECTED

## 2019-01-08 ENCOUNTER — Telehealth: Payer: Self-pay | Admitting: *Deleted

## 2019-01-08 DIAGNOSIS — B9689 Other specified bacterial agents as the cause of diseases classified elsewhere: Secondary | ICD-10-CM

## 2019-01-08 MED ORDER — METRONIDAZOLE 500 MG PO TABS: 500.0000 mg | ORAL_TABLET | Freq: Two times a day (BID) | ORAL | 0 refills | Status: DC

## 2019-01-08 MED FILL — metroNIDAZOLE 500 MG TABS: 500 | 7 days supply | Qty: 14 | Fill #0

## 2019-01-08 NOTE — Telephone Encounter (Signed)
-----   Message from Laury Deep, [place] sent at 01/08/2019  8:36 AM EDT ----- Please treat for BV

## 2019-01-09 ENCOUNTER — Telehealth (INDEPENDENT_AMBULATORY_CARE_PROVIDER_SITE_OTHER): Payer: Managed Care, Other (non HMO) | Admitting: Clinical

## 2019-01-09 ENCOUNTER — Other Ambulatory Visit: Payer: Self-pay

## 2019-01-09 DIAGNOSIS — F3163 Bipolar disorder, current episode mixed, severe, without psychotic features: Secondary | ICD-10-CM | POA: Diagnosis not present
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
## 2019-01-10 ENCOUNTER — Telehealth (HOSPITAL_COMMUNITY): Payer: Self-pay | Admitting: Professional

## 2019-01-13 ENCOUNTER — Other Ambulatory Visit: Payer: Self-pay

## 2019-01-13 ENCOUNTER — Telehealth: Payer: Managed Care, Other (non HMO) | Admitting: Clinical

## 2019-01-13 ENCOUNTER — Encounter: Payer: Self-pay | Admitting: Clinical

## 2019-01-13 DIAGNOSIS — Z91199 Patient's noncompliance with other medical treatment and regimen due to unspecified reason: Secondary | ICD-10-CM

## 2019-01-13 DIAGNOSIS — Z5329 Procedure and treatment not carried out because of patient's decision for other reasons: Secondary | ICD-10-CM

## 2019-01-13 NOTE — BH Specialist Note (Signed)
Pt did not arrive to video visit and did not answer the phone; Left HIPPA-compliant message to call back [name] from [facility] [employer] at [phone], and left MyChart message for patient.   Kelly via Telemedicine Video Visit  01/13/2019 Tiffany [name] [id number]  Garlan Fair

## 2019-01-29 ENCOUNTER — Encounter (INDEPENDENT_AMBULATORY_CARE_PROVIDER_SITE_OTHER): Payer: Self-pay | Admitting: Primary Care

## 2019-01-29 ENCOUNTER — Ambulatory Visit (INDEPENDENT_AMBULATORY_CARE_PROVIDER_SITE_OTHER): Payer: Managed Care, Other (non HMO) | Admitting: Primary Care

## 2019-01-29 ENCOUNTER — Other Ambulatory Visit: Payer: Self-pay

## 2019-01-29 VITALS — BP 116/80 | HR 86 | Temp 97.1°F | Ht 66.0 in | Wt 162.6 lb

## 2019-01-29 DIAGNOSIS — Z131 Encounter for screening for diabetes mellitus: Secondary | ICD-10-CM | POA: Diagnosis not present

## 2019-01-29 DIAGNOSIS — K429 Umbilical hernia without obstruction or gangrene: Secondary | ICD-10-CM | POA: Diagnosis not present

## 2019-01-29 DIAGNOSIS — Z86718 Personal history of other venous thrombosis and embolism: Secondary | ICD-10-CM

## 2019-01-29 DIAGNOSIS — Z833 Family history of diabetes mellitus: Secondary | ICD-10-CM

## 2019-01-29 DIAGNOSIS — N920 Excessive and frequent menstruation with regular cycle: Secondary | ICD-10-CM | POA: Diagnosis not present

## 2019-01-29 DIAGNOSIS — Z0001 Encounter for general adult medical examination with abnormal findings: Secondary | ICD-10-CM | POA: Diagnosis not present

## 2019-01-29 DIAGNOSIS — Z7901 Long term (current) use of anticoagulants: Secondary | ICD-10-CM

## 2019-01-29 MED ORDER — RIVAROXABAN 20 MG PO TABS: 20.0000 mg | ORAL_TABLET | Freq: Every day | ORAL | 3 refills | Status: DC

## 2019-01-29 NOTE — Patient Instructions (Signed)
Umbilical Hernia, Adult  A hernia is a bulge of tissue that pushes through an opening between muscles. An umbilical hernia happens in the abdomen, near the belly button (umbilicus). The hernia may contain tissues from the small intestine, large intestine, or fatty tissue covering the intestines (omentum). Umbilical hernias in adults tend to get worse over time, and they require surgical treatment. There are several types of umbilical hernias. You may have:  A hernia located just above or below the umbilicus (indirect hernia). This is the most common type of umbilical hernia in adults.  A hernia that forms through an opening formed by the umbilicus (direct hernia).  A hernia that comes and goes (reducible hernia). A reducible hernia may be visible only when you strain, lift something heavy, or cough. This type of hernia can be pushed back into the abdomen (reduced).  A hernia that traps abdominal tissue inside the hernia (incarcerated hernia). This type of hernia cannot be reduced.  A hernia that cuts off blood flow to the tissues inside the hernia (strangulated hernia). The tissues can start to die if this happens. This type of hernia requires emergency treatment. What are the causes? An umbilical hernia happens when tissue inside the abdomen presses on a weak area of the abdominal muscles. What increases the risk? You may have a greater risk of this condition if you:  Are obese.  Have had several pregnancies.  Have a buildup of fluid inside your abdomen (ascites).  Have had surgery that weakens the abdominal muscles. What are the signs or symptoms? The main symptom of this condition is a painless bulge at or near the belly button. A reducible hernia may be visible only when you strain, lift something heavy, or cough. Other symptoms may include:  Dull pain.  A feeling of pressure. Symptoms of a strangulated hernia may include:  Pain that gets increasingly worse.  Nausea and  vomiting.  Pain when pressing on the hernia.  Skin over the hernia becoming red or purple.  Constipation.  Blood in the stool. How is this diagnosed? This condition may be diagnosed based on:  A physical exam. You may be asked to cough or strain while standing. These actions increase the pressure inside your abdomen and force the hernia through the opening in your muscles. Your health care provider may try to reduce the hernia by pressing on it.  Your symptoms and medical history. How is this treated? Surgery is the only treatment for an umbilical hernia. Surgery for a strangulated hernia is done as soon as possible. If you have a small hernia that is not incarcerated, you may need to lose weight before having surgery. Follow these instructions at home:  Lose weight, if told by your health care provider.  Do not try to push the hernia back in.  Watch your hernia for any changes in color or size. Tell your health care provider if any changes occur.  You may need to avoid activities that increase pressure on your hernia.  Do not lift anything that is heavier than 10 lb (4.5 kg) until your health care provider says that this is safe.  Take over-the-counter and prescription medicines only as told by your health care provider.  Keep all follow-up visits as told by your health care provider. This is important. Contact a health care provider if:  Your hernia gets larger.  Your hernia becomes painful. Get help right away if:  You develop sudden, severe pain near the area of your hernia.    You have pain as well as nausea or vomiting.  You have pain and the skin over your hernia changes color.  You develop a fever. This information is not intended to replace advice given to you by your health care provider. Make sure you discuss any questions you have with your health care provider. Document Released: 09/17/2015 Document Revised: 05/30/2017 Document Reviewed: 10/16/2016 Elsevier  Patient Education  2020 Elsevier Inc.  

## 2019-01-29 NOTE — Progress Notes (Signed)
Established Patient Office Visit  Subjective:  Patient ID: ELONA YINGER, female    DOB: 20-May-1985  Age: 33 y.o. MRN: [id number]  CC:  Chief Complaint  Patient presents with  . DVT    HPI Chalsey L Moret presents for in person visit to annual visit.   Past Medical History:  Diagnosis Date  . Anemia 2007   HGB 11.04 Feb 2009.  Marland Kitchen Asthma   . Deep vein thrombosis (DVT) (HCC)    left leg  . Hernia, umbilical    plans for repair soon  . Lupus anticoagulant disorder ([place]) 11/07   Detected  . Missed abortion 1/10   SP D&C  . Preterm labor   . SAB (spontaneous abortion) 1/09    Past Surgical History:  Procedure Laterality Date  . CESAREAN SECTION  [id number]  . CESAREAN SECTION  11/20/2010   Procedure: CESAREAN SECTION;  Surgeon: Shelly Bombard, MD;  Location: [facility];  Service: Gynecology;  Laterality: N/A;  repeat cesarean section   . CESAREAN SECTION  11/15/2011   Procedure: CESAREAN SECTION;  Surgeon: Frederico Hamman, MD;  Location: [facility];  Service: Gynecology;  Laterality: N/A;  . DILATION AND CURETTAGE OF UTERUS  1/10   For missed AB  . HYSTEROSCOPY W/D&C N/A 10/15/2013   Procedure: HYSTEROSCOPY/DILATATION AND CURETTAGE;  Surgeon: Frederico Hamman, MD;  Location: [facility];  Service: Gynecology;  Laterality: N/A;  . TONSILLECTOMY      Family History  Problem Relation Age of Onset  . Hyperlipidemia Mother   . Hypertension Mother   . Arthritis Mother   . Hypertension Father   . Hyperlipidemia Father   . Diabetes Father   . Stroke Father   . Cataracts Father   . Other Neg Hx     Social History   Socioeconomic History  . Marital status: Married    Spouse name: Not on file  . Number of children: Not on file  . Years of education: Not on file  . Highest education level: Not on file  Occupational History  . Occupation: CNA  Social Needs  . Financial resource strain: Not on file  . Food insecurity    Worry: Not on file    Inability: Not on file   . Transportation needs    Medical: Not on file    Non-medical: Not on file  Tobacco Use  . Smoking status: Never Smoker  . Smokeless tobacco: Never Used  Substance and Sexual Activity  . Alcohol use: No  . Drug use: No  . Sexual activity: Yes    Birth control/protection: Surgical  Lifestyle  . Physical activity    Days per week: Not on file    Minutes per session: Not on file  . Stress: Not on file  Relationships  . Social Herbalist on phone: Not on file    Gets together: Not on file    Attends religious service: Not on file    Active member of club or organization: Not on file    Attends meetings of clubs or organizations: Not on file    Relationship status: Not on file  . Intimate partner violence    Fear of current or ex partner: Not on file    Emotionally abused: Not on file    Physically abused: Not on file    Forced sexual activity: Not on file  Other Topics Concern  . Not on file  Social History Narrative   Works  as a CNA, married, gets regular exercise, lives in [place] with husband/kids.     Outpatient Medications Prior to Visit  Medication Sig Dispense Refill  . Multiple Vitamin (MULTIVITAMIN) tablet Take 1 tablet by mouth daily.    . rivaroxaban (XARELTO) 20 MG TABS tablet Take 1 tablet (20 mg total) by mouth daily after lunch. 30 tablet 30  . Iron Combinations (IRON COMPLEX PO) Take 10 mg by mouth daily.    Marland Kitchen acetaminophen (TYLENOL) 500 MG tablet Take 1 tablet (500 mg total) by mouth every 6 (six) hours as needed. 30 tablet 0  . albuterol (PROVENTIL HFA;VENTOLIN HFA) 108 (90 Base) MCG/ACT inhaler Inhale 2 puffs into the lungs every 6 (six) hours as needed for wheezing or shortness of breath. 1 Inhaler 1  . hydroxychloroquine (PLAQUENIL) 200 MG tablet TAKE 1 TABLET BY MOUTH TWICE DAILY WITH FOOD OR MILK  2  . LORazepam (ATIVAN) 1 MG tablet Take 0.5 tablets (0.5 mg total) by mouth every 6 (six) hours as needed for anxiety. 5 tablet 0  . metroNIDAZOLE  (FLAGYL) 500 MG tablet Take 1 tablet (500 mg total) by mouth 2 (two) times daily. 14 tablet 0  . traMADol (ULTRAM) 50 MG tablet Take 1 tablet (50 mg total) by mouth every 8 (eight) hours as needed. 30 tablet 0  . Vitamin D, Ergocalciferol, (DRISDOL) 50000 units CAPS capsule Take 1 capsule (50,000 Units total) by mouth every 7 (seven) days. 8 capsule 0   No facility-administered medications prior to visit.     No Known Allergies  ROS Review of Systems  Genitourinary: Positive for menstrual problem.  Skin: Positive for color change.       Hands turn blue  Neurological: Positive for dizziness and light-headedness.  Psychiatric/Behavioral: The patient is nervous/anxious.   All other systems reviewed and are negative.     Objective:    Physical Exam  Constitutional: She is oriented to person, place, and time. She appears well-developed and well-nourished.  HENT:  Head: Normocephalic.  Eyes: Pupils are equal, round, and reactive to light. EOM are normal.  Neck: Normal range of motion. Neck supple.  Cardiovascular: Normal rate and regular rhythm.  Pulmonary/Chest: Effort normal and breath sounds normal.  Abdominal: Soft. Bowel sounds are normal.  Umbilical hernia   Musculoskeletal: Normal range of motion.  Neurological: She is alert and oriented to person, place, and time.  Skin: Skin is warm.  Psychiatric: She has a normal mood and affect.    BP 116/80 (BP Location: Right Arm, Patient Position: Sitting, Cuff Size: Normal)   Pulse 86   Temp (!) 97.1 F (36.2 C) (Temporal)   Ht '5\' 6"'  (1.676 m)   Wt 162 lb 9.6 oz (73.8 kg)   LMP 01/21/2019 (Approximate)   SpO2 99%   BMI 26.24 kg/m  Wt Readings from Last 3 Encounters:  01/29/19 162 lb 9.6 oz (73.8 kg)  01/02/19 160 lb (72.6 kg)  02/01/17 138 lb (62.6 kg)     There are no preventive care reminders to display for this patient.  There are no preventive care reminders to display for this patient.  Lab Results  Component  Value Date   TSH 0.72 07/20/2016   Lab Results  Component Value Date   WBC 9.5 07/22/2016   HGB 12.5 07/22/2016   HCT 37.6 07/22/2016   MCV 90.0 07/22/2016   PLT 364 07/22/2016   Lab Results  Component Value Date   NA 139 07/22/2016   K 4.2 07/22/2016  CO2 23 07/22/2016   GLUCOSE 97 07/22/2016   BUN 9 07/22/2016   CREATININE 0.93 07/22/2016   BILITOT 0.5 07/22/2016   ALKPHOS 32 (L) 07/22/2016   AST 25 07/22/2016   ALT 16 07/22/2016   PROT 7.3 07/22/2016   ALBUMIN 4.4 07/22/2016   CALCIUM 9.9 07/22/2016   ANIONGAP 10 07/22/2016   Lab Results  Component Value Date   CHOL 99 (L) 08/26/2015   Lab Results  Component Value Date   HDL 53 08/26/2015   Lab Results  Component Value Date   LDLCALC 40 08/26/2015   Lab Results  Component Value Date   TRIG 31 08/26/2015   Lab Results  Component Value Date   CHOLHDL 1.9 08/26/2015   No results found for: HGBA1C    Assessment & Plan:  Evadean was seen today for dvt.  Diagnoses and all orders for this visit:  Annual visit for general adult medical examination with abnormal findings except umbilical hernia -     [id number]+YOVZ  Menorrhagia with regular cycle Menorrhalgia heavy or prolonged vaginal bleeding with menstrual cycles.  This may be related to hormonal problems, problems with the uterus or other health conditions.  Menstrual cycles can be normal or irregular. -     CBC with Differential  Diabetes mellitus screening Family history of diabetes  -     CMP14+EGFR -     HgB C5Y  Umbilical hernia without obstruction and without gangrene  umbilical hernia is tissue inside the abdomen presses on a weak area of the abdominal muscles bulging out when asked to press down like having a bowel movement. 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 original dvt noted 13 years ago.   Other orders -     rivaroxaban (XARELTO) 20 MG TABS  tablet; Take 1 tablet (20 mg total) by mouth daily after lunch.   Meds ordered this encounter  Medications  . rivaroxaban (XARELTO) 20 MG TABS tablet    Sig: Take 1 tablet (20 mg total) by mouth daily after lunch.    Dispense:  30 tablet    Refill:  3    Follow-up: Return if symptoms worsen or fail to improve.    Kerin Perna, NP

## 2019-01-30 LAB — CBC WITH DIFFERENTIAL/PLATELET
Basophils Absolute: 0.1 10*3/uL (ref 0.0–0.2)
Basos: 1 %
EOS (ABSOLUTE): 0.1 10*3/uL (ref 0.0–0.4)
Eos: 2 %
Hematocrit: 37.3 % (ref 34.0–46.6)
Hemoglobin: 12.5 g/dL (ref 11.1–15.9)
Immature Grans (Abs): 0 10*3/uL (ref 0.0–0.1)
Immature Granulocytes: 0 %
Lymphocytes Absolute: 4 10*3/uL — ABNORMAL HIGH (ref 0.7–3.1)
Lymphs: 45 %
MCH: 28.9 pg (ref 26.6–33.0)
MCHC: 33.5 g/dL (ref 31.5–35.7)
MCV: 86 fL (ref 79–97)
Monocytes Absolute: 0.7 10*3/uL (ref 0.1–0.9)
Monocytes: 8 %
Neutrophils Absolute: 3.9 10*3/uL (ref 1.4–7.0)
Neutrophils: 44 %
Platelets: 422 10*3/uL (ref 150–450)
RBC: 4.33 x10E6/uL (ref 3.77–5.28)
RDW: 12.6 % (ref 11.7–15.4)
WBC: 8.8 10*3/uL (ref 3.4–10.8)

## 2019-01-30 LAB — CMP14+EGFR
ALT: 17 IU/L (ref 0–32)
AST: 18 IU/L (ref 0–40)
Albumin/Globulin Ratio: 1.7 (ref 1.2–2.2)
Albumin: 4.7 g/dL (ref 3.8–4.8)
Alkaline Phosphatase: 49 IU/L (ref 39–117)
BUN/Creatinine Ratio: 9 (ref 9–23)
BUN: 8 mg/dL (ref 6–20)
Bilirubin Total: 0.2 mg/dL (ref 0.0–1.2)
CO2: 22 mmol/L (ref 20–29)
Calcium: 9.9 mg/dL (ref 8.7–10.2)
Chloride: 104 mmol/L (ref 96–106)
Creatinine, Ser: 0.93 mg/dL (ref 0.57–1.00)
GFR calc Af Amer: 93 mL/min/{1.73_m2} (ref >59)
GFR calc non Af Amer: 80 mL/min/{1.73_m2} (ref >59)
Globulin, Total: 2.7 g/dL (ref 1.5–4.5)
Glucose: 83 mg/dL (ref 65–99)
Potassium: 4.6 mmol/L (ref 3.5–5.2)
Sodium: 141 mmol/L (ref 134–144)
Total Protein: 7.4 g/dL (ref 6.0–8.5)

## 2019-01-30 LAB — HEMOGLOBIN A1C
Est. average glucose Bld gHb Est-mCnc: 117 mg/dL
Hgb A1c MFr Bld: 5.7 % — ABNORMAL HIGH (ref 4.8–5.6)

## 2019-02-03 ENCOUNTER — Ambulatory Visit (HOSPITAL_COMMUNITY): Payer: Managed Care, Other (non HMO) | Admitting: Licensed Clinical Social Worker

## 2019-02-05 ENCOUNTER — Other Ambulatory Visit: Payer: Self-pay

## 2019-02-05 ENCOUNTER — Encounter (HOSPITAL_COMMUNITY): Payer: Self-pay | Admitting: Licensed Clinical Social Worker

## 2019-02-05 ENCOUNTER — Ambulatory Visit (INDEPENDENT_AMBULATORY_CARE_PROVIDER_SITE_OTHER): Payer: 59 | Admitting: Licensed Clinical Social Worker

## 2019-02-05 DIAGNOSIS — F3163 Bipolar disorder, current episode mixed, severe, without psychotic features: Secondary | ICD-10-CM
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
## 2019-02-06 ENCOUNTER — Telehealth (HOSPITAL_COMMUNITY): Payer: Self-pay | Admitting: Professional

## 2019-11-10 ENCOUNTER — Encounter (INDEPENDENT_AMBULATORY_CARE_PROVIDER_SITE_OTHER): Payer: Self-pay | Admitting: Primary Care

## 2019-11-14 ENCOUNTER — Encounter (INDEPENDENT_AMBULATORY_CARE_PROVIDER_SITE_OTHER): Payer: Self-pay | Admitting: Primary Care

## 2019-11-14 ENCOUNTER — Ambulatory Visit (INDEPENDENT_AMBULATORY_CARE_PROVIDER_SITE_OTHER): Payer: Managed Care, Other (non HMO) | Admitting: Primary Care

## 2019-11-14 ENCOUNTER — Other Ambulatory Visit: Payer: Self-pay

## 2019-11-14 VITALS — BP 112/78 | HR 82 | Temp 98.5°F | Ht 66.0 in | Wt 167.6 lb

## 2019-11-14 DIAGNOSIS — F40298 Other specified phobia: Secondary | ICD-10-CM | POA: Diagnosis not present

## 2019-11-14 DIAGNOSIS — K429 Umbilical hernia without obstruction or gangrene: Secondary | ICD-10-CM | POA: Diagnosis not present

## 2019-11-14 DIAGNOSIS — Z7901 Long term (current) use of anticoagulants: Secondary | ICD-10-CM

## 2019-11-14 NOTE — Patient Instructions (Signed)

## 2019-11-14 NOTE — Progress Notes (Signed)
Established Patient Office Visit  Subjective:  Patient ID: Tiffany [name], female    DOB: Jan 03, 1986  Age: 34 y.o. MRN: [id number]  CC:  Chief Complaint  Patient presents with  . Referral  . Hernia  . dvt    HPI Tiffany [name] presents for questions and other option for the management of chronic DVT's. She also voiced concerns with a hernia. Painful with bowel movements and constipation.   Past Medical History:  Diagnosis Date  . Anemia 2007   HGB 11.04 Feb 2009.  Marland Kitchen Asthma   . Deep vein thrombosis (DVT) (HCC)    left leg  . Hernia, umbilical    plans for repair soon  . 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 Missed abortion 1/10   SP D&C  . Preterm labor   . SAB (spontaneous abortion) 1/09    Past Surgical History:  Procedure Laterality Date  . CESAREAN SECTION  [id number]  . CESAREAN SECTION  11/20/2010   Procedure: CESAREAN SECTION;  Surgeon: Brock Bad, MD;  Location: [facility];  Service: Gynecology;  Laterality: N/A;  repeat cesarean section   . CESAREAN SECTION  11/15/2011   Procedure: CESAREAN SECTION;  Surgeon: Kathreen Cosier, MD;  Location: [facility];  Service: Gynecology;  Laterality: N/A;  . DILATION AND CURETTAGE OF UTERUS  1/10   For missed AB  . HYSTEROSCOPY WITH D & C N/A 10/15/2013   Procedure: HYSTEROSCOPY/DILATATION AND CURETTAGE;  Surgeon: Kathreen Cosier, MD;  Location: [facility];  Service: Gynecology;  Laterality: N/A;  . TONSILLECTOMY      Family History  Problem Relation Age of Onset  . Hyperlipidemia Mother   . Hypertension Mother   . Arthritis Mother   . Hypertension Father   . Hyperlipidemia Father   . Diabetes Father   . Stroke Father   . Cataracts Father   . Other Neg Hx     Social History   Socioeconomic History  . Marital status: Married    Spouse name: Not on file  . Number of children: Not on file  . Years of education: Not on file  . Highest education level: Not on file  Occupational  History  . Occupation: CNA  Tobacco Use  . Smoking status: Never Smoker  . Smokeless tobacco: Never Used  Vaping Use  . Vaping Use: Never used  Substance and Sexual Activity  . Alcohol use: No  . Drug use: No  . Sexual activity: Yes    Birth control/protection: Surgical  Other Topics Concern  . Not on file  Social History Narrative   Works as a Lawyer, married, gets regular exercise, lives in [place] with husband/kids.    Social Determinants of Health   Financial Resource Strain:   . Difficulty of Paying Living Expenses:   Food Insecurity:   . Worried About Programme researcher, broadcasting/film/video in the Last Year:   . Barista in the Last Year:   Transportation Needs:   . Freight forwarder (Medical):   Marland Kitchen Lack of Transportation (Non-Medical):   Physical Activity:   . Days of Exercise per Week:   . Minutes of Exercise per Session:   Stress:   . Feeling of Stress :   Social Connections:   . Frequency of Communication with Friends and Family:   . Frequency of Social Gatherings with Friends and Family:   . Attends Religious Services:   . Active Member of Clubs or  Organizations:   . Attends Banker Meetings:   Marland Kitchen Marital Status:   Intimate Partner Violence:   . Fear of Current or Ex-Partner:   . Emotionally Abused:   Marland Kitchen Physically Abused:   . Sexually Abused:     Outpatient Medications Prior to Visit  Medication Sig Dispense Refill  . Iron Combinations (IRON COMPLEX PO) Take 10 mg by mouth daily.    . Multiple Vitamin (MULTIVITAMIN) tablet Take 1 tablet by mouth daily.    . rivaroxaban (XARELTO) 20 MG TABS tablet Take 1 tablet (20 mg total) by mouth daily after lunch. 30 tablet 3   No facility-administered medications prior to visit.    No Known Allergies  ROS Review of Systems  Gastrointestinal: Positive for abdominal distention, abdominal pain and constipation.  All other systems reviewed and are negative.     Objective:    Physical Exam Vitals reviewed.   HENT:     Head: Normocephalic.     Right Ear: Tympanic membrane normal.     Left Ear: Tympanic membrane normal.  Eyes:     Extraocular Movements: Extraocular movements intact.     Pupils: Pupils are equal, round, and reactive to light.  Cardiovascular:     Rate and Rhythm: Normal rate and regular rhythm.     Pulses: Normal pulses.     Heart sounds: Normal heart sounds.  Pulmonary:     Effort: Pulmonary effort is normal.     Breath sounds: Normal breath sounds.  Abdominal:     General: Bowel sounds are normal.     Tenderness: There is abdominal tenderness. There is guarding.     Hernia: A hernia is present.  Musculoskeletal:        General: Normal range of motion.     Cervical back: Normal range of motion and neck supple.  Skin:    General: Skin is warm and dry.  Neurological:     Mental Status: She is alert and oriented to person, place, and time.  Psychiatric:        Mood and Affect: Mood normal.        Behavior: Behavior normal.        Thought Content: Thought content normal.        Judgment: Judgment normal.     BP 112/78 (BP Location: Right Arm, Patient Position: Sitting, Cuff Size: Normal)   Pulse 82   Temp 98.5 F (36.9 C) (Oral)   Ht 5\' 6"  (1.676 m)   Wt 167 lb 9.6 oz (76 kg)   LMP 10/31/2019 (Approximate)   SpO2 98%   BMI 27.05 kg/m  Wt Readings from Last 3 Encounters:  11/14/19 167 lb 9.6 oz (76 kg)  01/29/19 162 lb 9.6 oz (73.8 kg)  01/02/19 160 lb (72.6 kg)     Health Maintenance Due  Topic Date Due  . Hepatitis C Screening  Never done    There are no preventive care reminders to display for this patient.  Lab Results  Component Value Date   TSH 0.72 07/20/2016   Lab Results  Component Value Date   WBC 8.8 01/29/2019   HGB 12.5 01/29/2019   HCT 37.3 01/29/2019   MCV 86 01/29/2019   PLT 422 01/29/2019   Lab Results  Component Value Date   NA 141 01/29/2019   K 4.6 01/29/2019   CO2 22 01/29/2019   GLUCOSE 83 01/29/2019   BUN 8  01/29/2019   CREATININE 0.93 01/29/2019   BILITOT 0.2 01/29/2019  ALKPHOS 49 01/29/2019   AST 18 01/29/2019   ALT 17 01/29/2019   PROT 7.4 01/29/2019   ALBUMIN 4.7 01/29/2019   CALCIUM 9.9 01/29/2019   ANIONGAP 10 07/22/2016   Lab Results  Component Value Date   CHOL 99 (L) 08/26/2015   Lab Results  Component Value Date   HDL 53 08/26/2015   Lab Results  Component Value Date   LDLCALC 40 08/26/2015   Lab Results  Component Value Date   TRIG 31 08/26/2015   Lab Results  Component Value Date   CHOLHDL 1.9 08/26/2015   Lab Results  Component Value Date   HGBA1C 5.7 (H) 01/29/2019      Assessment & Plan:  Kynlei was seen today for referral, hernia and dvt.  Diagnoses and all orders for this visit:  Umbilical hernia without obstruction and without gangrene -     Ambulatory referral to General Surgery  Chronic anticoagulation History of DVT's on xarelto 20mg  daily.  -     Ambulatory referral to Vascular Surgery  Fear of developing breast cancer Taught SBE no family history but recently had 2 close friends close in age dx with breast cancer and requesting a mammogram. Recommended starting at the age of 65 unless family history or suspicious mass appears. Verbalized understanding. She will start doing SBE monthly   No orders of the defined types were placed in this encounter.   Follow-up: Return in about 6 months (around 05/16/2020) for in person check up .    05/18/2020, NP

## 2019-12-09 ENCOUNTER — Encounter (INDEPENDENT_AMBULATORY_CARE_PROVIDER_SITE_OTHER): Payer: Self-pay | Admitting: Primary Care

## 2019-12-09 DIAGNOSIS — K429 Umbilical hernia without obstruction or gangrene: Secondary | ICD-10-CM

## 2019-12-17 NOTE — Telephone Encounter (Signed)
Copied from CRM [id number]. Topic: Referral - Status >> Dec 17, 2019  7:22 AM Tiffany [name] wrote: Reason for CRM: pt called to check on her referral status to vein specialist.  It has been closed and denied. The general surgery referral states do not schedule.  Can you please follow up with pt on referrals.   Please advice patient of the steps she needs to do.   [facility] does not manage DTV's.

## 2019-12-18 NOTE — Telephone Encounter (Signed)
Copied from CRM [id number]. Topic: Referral - Status >> Dec 17, 2019  7:22 AM Crist Infante wrote: Reason for CRM: pt called to check on her referral status to vein specialist.  It has been closed and denied. The general surgery referral states do not schedule.  Can you please follow up with pt on referrals. >> Dec 18, 2019  2:12 PM Leafy Ro wrote: Pt is calling to let michelle know dr Fayrene Fearing wyatt no longer doing hernia surgery and she would need another referral to someone in that office   Please advice patient or send referral to a new location. thank you

## 2020-01-27 ENCOUNTER — Other Ambulatory Visit: Payer: Self-pay | Admitting: Surgery

## 2020-02-23 ENCOUNTER — Encounter (HOSPITAL_BASED_OUTPATIENT_CLINIC_OR_DEPARTMENT_OTHER): Payer: Self-pay | Admitting: Surgery

## 2020-02-23 ENCOUNTER — Other Ambulatory Visit: Payer: Self-pay

## 2020-02-26 ENCOUNTER — Other Ambulatory Visit (HOSPITAL_COMMUNITY)
Admission: RE | Admit: 2020-02-26 | Discharge: 2020-02-26 | Disposition: A | Discharge status: Home / Self Care | Payer: Managed Care, Other (non HMO) | Source: Ambulatory Visit | Attending: Surgery | Admitting: Surgery

## 2020-02-26 DIAGNOSIS — Z01812 Encounter for preprocedural laboratory examination: Secondary | ICD-10-CM | POA: Diagnosis present

## 2020-02-26 DIAGNOSIS — Z20822 Contact with and (suspected) exposure to covid-19: Secondary | ICD-10-CM | POA: Insufficient documentation

## 2020-02-26 LAB — SARS CORONAVIRUS 2 (TAT 6-24 HRS): SARS Coronavirus 2: NEGATIVE

## 2020-02-26 MED ORDER — ENSURE PRE-SURGERY PO LIQD: 296.0000 mL | Freq: Once | ORAL | Status: DC

## 2020-02-26 NOTE — Progress Notes (Signed)

## 2020-02-29 NOTE — H&P (Signed)
   Tiffany [name]  Location: [facility] Patient #: [id number] DOB: 1986/04/19 Married / Language: English / Race: Black or African American Female   History of Present Illness The patient is a 34 year old female who presents with an umbilical hernia.  Chief complaint: Umbilical hernia  This is a very pleasant 34 year old female referred here for evaluation of umbilical hernia. She has had for several years. It is slowly getting larger and causing some discomfort. She is on chronic anticoagulation medications given her history of DVTs and lupus anticoagulant. She has never had a PE. She is otherwise healthy without complaints.   Allergies (Chanel Lonni Fix, CMA; No Known Drug Allergies  Allergies Reconciled   Medication History (Chanel Lonni Fix, CMA; Xarelto (20MG  Tablet, Oral) Active. Medications Reconciled  Vitals (Chanel [facility] 01/27/2020 3:57 PM Weight: 165 lb Height: 66in Body Surface Area: 1.84 m Body Mass Index: 26.63 kg/m  Temp.: 97.17F  Pulse: 84 (Regular)  BP: 120/82(Sitting, Left Arm, Standard)       Physical Exam The physical exam findings are as follows: Note: She appears well exam  Her abdomen is soft and nontender.  She has a moderate sized, easily reducible umbilical hernia    Assessment & Plan  UMBILICAL HERNIA (K42.9)  Impression: I reviewed her notes in the electronic medical records. I have discussed the diagnosis with her umbilical hernia. We discussed abdominal wall anatomy. As she is symptomatic and it is getting larger, repair with possible mesh is recommended. I would especially recommend this given her history of the need for chronic anticoagulation and the risk this would impose on her if she came and incarcerated. I discussed the surgical procedure in detail. We discussed the risk which includes but is not limited to bleeding, infection, the use of mesh, injury to his right structures, hernia recurrence,  postoperative recovery, cardiopulmonary issues, etc. I would resume her anticoagulation medications the evening of surgery. She understands and wished to proceed with surgery

## 2020-03-01 ENCOUNTER — Ambulatory Visit (HOSPITAL_BASED_OUTPATIENT_CLINIC_OR_DEPARTMENT_OTHER)
Admission: RE | Admit: 2020-03-01 | Discharge: 2020-03-01 | Disposition: A | Discharge status: Home / Self Care | Payer: Managed Care, Other (non HMO) | Attending: Surgery | Admitting: Surgery

## 2020-03-01 ENCOUNTER — Encounter (HOSPITAL_BASED_OUTPATIENT_CLINIC_OR_DEPARTMENT_OTHER): Payer: Self-pay

## 2020-03-01 ENCOUNTER — Ambulatory Visit (HOSPITAL_BASED_OUTPATIENT_CLINIC_OR_DEPARTMENT_OTHER): Admit: 2020-03-01 | Payer: Managed Care, Other (non HMO) | Admitting: Surgery

## 2020-03-01 ENCOUNTER — Ambulatory Visit (HOSPITAL_BASED_OUTPATIENT_CLINIC_OR_DEPARTMENT_OTHER): Payer: Managed Care, Other (non HMO) | Admitting: Certified Registered Nurse Anesthetist

## 2020-03-01 ENCOUNTER — Encounter (HOSPITAL_BASED_OUTPATIENT_CLINIC_OR_DEPARTMENT_OTHER)
Admission: RE | Disposition: A | Discharge status: Home / Self Care | Payer: Self-pay | Source: Home / Self Care | Attending: Surgery

## 2020-03-01 ENCOUNTER — Encounter (HOSPITAL_BASED_OUTPATIENT_CLINIC_OR_DEPARTMENT_OTHER): Payer: Self-pay | Admitting: Surgery

## 2020-03-01 DIAGNOSIS — K429 Umbilical hernia without obstruction or gangrene: Secondary | ICD-10-CM | POA: Insufficient documentation

## 2020-03-01 DIAGNOSIS — Z7901 Long term (current) use of anticoagulants: Secondary | ICD-10-CM | POA: Insufficient documentation

## 2020-03-01 DIAGNOSIS — Z86718 Personal history of other venous thrombosis and embolism: Secondary | ICD-10-CM | POA: Diagnosis not present

## 2020-03-01 HISTORY — PX: INSERTION OF MESH: SHX5868

## 2020-03-01 HISTORY — PX: UMBILICAL HERNIA REPAIR: SHX196

## 2020-03-01 LAB — POCT PREGNANCY, URINE: Preg Test, Ur: NEGATIVE

## 2020-03-01 SURGERY — REPAIR, HERNIA, UMBILICAL, ADULT
Anesthesia: General | Site: Abdomen

## 2020-03-01 SURGERY — REPAIR, HERNIA, UMBILICAL, ADULT
Anesthesia: General

## 2020-03-01 MED ORDER — BUPIVACAINE-EPINEPHRINE 0.5% -1:200000 IJ SOLN
INTRAMUSCULAR | Status: DC | PRN
  Administered 2020-03-01: 20 mL

## 2020-03-01 MED ORDER — CHLORHEXIDINE GLUCONATE CLOTH 2 % EX PADS: 6.0000 | MEDICATED_PAD | Freq: Once | CUTANEOUS | Status: DC

## 2020-03-01 MED ORDER — LIDOCAINE 2% (20 MG/ML) 5 ML SYRINGE
INTRAMUSCULAR | Status: DC | PRN
  Administered 2020-03-01: 80 mg via INTRAVENOUS

## 2020-03-01 MED ORDER — FENTANYL CITRATE (PF) 100 MCG/2ML IJ SOLN
INTRAMUSCULAR | Status: AC
  Filled 2020-03-01: qty 2

## 2020-03-01 MED ORDER — OXYCODONE HCL 5 MG PO TABS: 5.0000 mg | ORAL_TABLET | Freq: Once | ORAL | Status: DC | PRN

## 2020-03-01 MED ORDER — OXYCODONE HCL 5 MG PO TABS
5.0000 mg | ORAL_TABLET | Freq: Four times a day (QID) | ORAL | 0 refills | Status: DC | PRN
Start: 2020-03-01 — End: 2020-03-15

## 2020-03-01 MED ORDER — ACETAMINOPHEN 500 MG PO TABS
ORAL_TABLET | ORAL | Status: AC
  Filled 2020-03-01: qty 2

## 2020-03-01 MED ORDER — PROPOFOL 10 MG/ML IV BOLUS
INTRAVENOUS | Status: DC | PRN
  Administered 2020-03-01: 200 mg via INTRAVENOUS

## 2020-03-01 MED ORDER — HYDROMORPHONE HCL 1 MG/ML IJ SOLN: 0.2500 mg | INTRAMUSCULAR | Status: DC | PRN

## 2020-03-01 MED ORDER — KETOROLAC TROMETHAMINE 30 MG/ML IJ SOLN
INTRAMUSCULAR | Status: AC
  Filled 2020-03-01: qty 1

## 2020-03-01 MED ORDER — MIDAZOLAM HCL 2 MG/2ML IJ SOLN
INTRAMUSCULAR | Status: AC
  Filled 2020-03-01: qty 2

## 2020-03-01 MED ORDER — GABAPENTIN 300 MG PO CAPS
300.0000 mg | ORAL_CAPSULE | ORAL | Status: AC
  Administered 2020-03-01: 300 mg via ORAL

## 2020-03-01 MED ORDER — FENTANYL CITRATE (PF) 100 MCG/2ML IJ SOLN
INTRAMUSCULAR | Status: DC | PRN
  Administered 2020-03-01 (×2): 50 ug via INTRAVENOUS

## 2020-03-01 MED ORDER — ACETAMINOPHEN 500 MG PO TABS
1000.0000 mg | ORAL_TABLET | ORAL | Status: AC
  Administered 2020-03-01: 1000 mg via ORAL

## 2020-03-01 MED ORDER — ONDANSETRON HCL 4 MG/2ML IJ SOLN
INTRAMUSCULAR | Status: DC | PRN
  Administered 2020-03-01: 4 mg via INTRAVENOUS

## 2020-03-01 MED ORDER — LACTATED RINGERS IV SOLN: INTRAVENOUS | Status: DC

## 2020-03-01 MED ORDER — CEFAZOLIN SODIUM-DEXTROSE 2-4 GM/100ML-% IV SOLN
INTRAVENOUS | Status: AC
  Filled 2020-03-01: qty 100

## 2020-03-01 MED ORDER — KETOROLAC TROMETHAMINE 30 MG/ML IJ SOLN
INTRAMUSCULAR | Status: DC | PRN
  Administered 2020-03-01: 30 mg via INTRAVENOUS

## 2020-03-01 MED ORDER — ONDANSETRON HCL 4 MG/2ML IJ SOLN: 4.0000 mg | Freq: Once | INTRAMUSCULAR | Status: DC | PRN

## 2020-03-01 MED ORDER — CEFAZOLIN SODIUM-DEXTROSE 2-4 GM/100ML-% IV SOLN
2.0000 g | INTRAVENOUS | Status: AC
  Administered 2020-03-01: 2 g via INTRAVENOUS

## 2020-03-01 MED ORDER — OXYCODONE HCL 5 MG/5ML PO SOLN: 5.0000 mg | Freq: Once | ORAL | Status: DC | PRN

## 2020-03-01 MED ORDER — GABAPENTIN 300 MG PO CAPS
ORAL_CAPSULE | ORAL | Status: AC
  Filled 2020-03-01: qty 1

## 2020-03-01 MED ORDER — MIDAZOLAM HCL 2 MG/2ML IJ SOLN
INTRAMUSCULAR | Status: DC | PRN
  Administered 2020-03-01: 2 mg via INTRAVENOUS

## 2020-03-01 MED ORDER — DEXAMETHASONE SODIUM PHOSPHATE 10 MG/ML IJ SOLN
INTRAMUSCULAR | Status: DC | PRN
  Administered 2020-03-01: 5 mg via INTRAVENOUS

## 2020-03-01 SURGICAL SUPPLY — 40 items
BLADE SURG 15 STRL LF DISP TIS (BLADE) ×1 IMPLANT
BLADE SURG 15 STRL SS (BLADE) ×3
CANISTER SUCT 1200ML W/VALVE (MISCELLANEOUS) IMPLANT
CHLORAPREP W/TINT 26 (MISCELLANEOUS) ×3 IMPLANT
COVER BACK TABLE 60X90IN (DRAPES) ×3 IMPLANT
COVER MAYO STAND STRL (DRAPES) ×3 IMPLANT
DECANTER SPIKE VIAL GLASS SM (MISCELLANEOUS) IMPLANT
DERMABOND ADVANCED (GAUZE/BANDAGES/DRESSINGS) ×2
DERMABOND ADVANCED .7 DNX12 (GAUZE/BANDAGES/DRESSINGS) ×1 IMPLANT
DRAPE LAPAROTOMY 100X72 PEDS (DRAPES) ×3 IMPLANT
DRAPE UTILITY XL STRL (DRAPES) ×3 IMPLANT
ELECT REM PT RETURN 9FT ADLT (ELECTROSURGICAL) ×3
ELECTRODE REM PT RTRN 9FT ADLT (ELECTROSURGICAL) ×1 IMPLANT
GLOVE BIO SURGEON STRL SZ 6.5 (GLOVE) ×2 IMPLANT
GLOVE BIO SURGEONS STRL SZ 6.5 (GLOVE) ×1
GLOVE SURG SIGNA 7.5 PF LTX (GLOVE) ×3 IMPLANT
GOWN STRL REUS W/ TWL LRG LVL3 (GOWN DISPOSABLE) ×1 IMPLANT
GOWN STRL REUS W/ TWL XL LVL3 (GOWN DISPOSABLE) ×1 IMPLANT
GOWN STRL REUS W/TWL LRG LVL3 (GOWN DISPOSABLE) ×3
GOWN STRL REUS W/TWL XL LVL3 (GOWN DISPOSABLE) ×3
MESH VENTRALEX ST 1-7/10 CRC S (Mesh General) ×3 IMPLANT
NEEDLE HYPO 25X1 1.5 SAFETY (NEEDLE) ×3 IMPLANT
NS IRRIG 1000ML POUR BTL (IV SOLUTION) IMPLANT
PACK BASIN DAY SURGERY FS (CUSTOM PROCEDURE TRAY) ×3 IMPLANT
PENCIL SMOKE EVACUATOR (MISCELLANEOUS) ×3 IMPLANT
SLEEVE SCD COMPRESS KNEE MED (MISCELLANEOUS) ×3 IMPLANT
SPONGE LAP 4X18 RFD (DISPOSABLE) ×3 IMPLANT
SUT MNCRL AB 4-0 PS2 18 (SUTURE) ×3 IMPLANT
SUT NOVA 0 T19/GS 22DT (SUTURE) IMPLANT
SUT NOVA NAB DX-16 0-1 5-0 T12 (SUTURE) IMPLANT
SUT NOVA NAB GS-21 1 T12 (SUTURE) IMPLANT
SUT VIC AB 2-0 SH 27 (SUTURE)
SUT VIC AB 2-0 SH 27XBRD (SUTURE) IMPLANT
SUT VIC AB 3-0 SH 27 (SUTURE) ×3
SUT VIC AB 3-0 SH 27X BRD (SUTURE) ×1 IMPLANT
SYR CONTROL 10ML LL (SYRINGE) ×3 IMPLANT
TOWEL GREEN STERILE FF (TOWEL DISPOSABLE) ×3 IMPLANT
TUBE CONNECTING 20'X1/4 (TUBING)
TUBE CONNECTING 20X1/4 (TUBING) IMPLANT
YANKAUER SUCT BULB TIP NO VENT (SUCTIONS) IMPLANT

## 2020-03-01 NOTE — Anesthesia Procedure Notes (Signed)
Procedure Name: LMA Insertion Date/Time: 03/01/2020 9:11 AM Performed by: [place], Raheem Kolbe C, CRNA Pre-anesthesia Checklist: Patient identified, Emergency Drugs available, Suction available and Patient being monitored Patient Re-evaluated:Patient Re-evaluated prior to induction Oxygen Delivery Method: Circle system utilized Preoxygenation: Pre-oxygenation with 100% oxygen Induction Type: IV induction Ventilation: Mask ventilation without difficulty LMA: LMA inserted LMA Size: 4.0 Number of attempts: 1 Airway Equipment and Method: Bite block Placement Confirmation: positive ETCO2 Tube secured with: Tape Dental Injury: Teeth and Oropharynx as per pre-operative assessment

## 2020-03-01 NOTE — Anesthesia Preprocedure Evaluation (Signed)
Anesthesia Evaluation  Patient identified by MRN, date of birth, ID band Patient awake    Reviewed: Patient's Chart, lab work & pertinent test results  Airway Mallampati: II  TM Distance: >3 FB Neck ROM: Full    Dental  (+) Teeth Intact   Pulmonary asthma ,    Pulmonary exam normal        Cardiovascular + DVT  negative cardio ROS   Rhythm:Regular Rate:Normal     Neuro/Psych negative neurological ROS  negative psych ROS   GI/Hepatic Neg liver ROS, Umbilical hernia    Endo/Other  negative endocrine ROS  Renal/GU negative Renal ROS  negative genitourinary   Musculoskeletal negative musculoskeletal ROS (+)   Abdominal (+)  Abdomen: soft. Bowel sounds: normal.  Peds  Hematology  (+) anemia ,   Anesthesia Other Findings   Reproductive/Obstetrics negative OB ROS                             Anesthesia Physical Anesthesia Plan  ASA: II  Anesthesia Plan: General   Post-op Pain Management:    Induction: Intravenous  PONV Risk Score and Plan: 3 and Ondansetron, Dexamethasone, Midazolam and Treatment may vary due to age or medical condition  Airway Management Planned: Mask and LMA  Additional Equipment: None  Intra-op Plan:   Post-operative Plan: Extubation in OR  Informed Consent: I have reviewed the patients History and Physical, chart, labs and discussed the procedure including the risks, benefits and alternatives for the proposed anesthesia with the patient or authorized representative who has indicated his/her understanding and acceptance.     Dental advisory given  Plan Discussed with: CRNA  Anesthesia Plan Comments: (Lab Results      Component                Value               Date                      PREGTESTUR               NEGATIVE            03/01/2020                HCG                      <5.0                07/22/2016          )        Anesthesia  Quick Evaluation

## 2020-03-01 NOTE — Discharge Instructions (Signed)
**No Tylenol until after 12:30pm.... No Ibuprofen until after 4:00pm  Post Anesthesia Home Care Instructions  Activity: Get plenty of rest for the remainder of the day. A responsible individual must stay with you for 24 hours following the procedure.  For the next 24 hours, DO NOT: -Drive a car -Advertising copywriter -Drink alcoholic beverages -Take any medication unless instructed by your physician -Make any legal decisions or sign important papers.  Meals: Start with liquid foods such as gelatin or soup. Progress to regular foods as tolerated. Avoid greasy, spicy, heavy foods. If nausea and/or vomiting occur, drink only clear liquids until the nausea and/or vomiting subsides. Call your physician if vomiting continues.  Special Instructions/Symptoms: Your throat may feel dry or sore from the anesthesia or the breathing tube placed in your throat during surgery. If this causes discomfort, gargle with warm salt water. The discomfort should disappear within 24 hours.  If you had a scopolamine patch placed behind your ear for the management of post- operative nausea and/or vomiting:  1. The medication in the patch is effective for 72 hours, after which it should be removed.  Wrap patch in a tissue and discard in the trash. Wash hands thoroughly with soap and water. 2. You may remove the patch earlier than 72 hours if you experience unpleasant side effects which may include dry mouth, dizziness or visual disturbances. 3. Avoid touching the patch. Wash your hands with soap and water after contact with the patch.     CC[facility] _______[facility]  UMBILICAL OR INGUINAL HERNIA REPAIR: POST OP INSTRUCTIONS  Always review your discharge instruction sheet given to you by the facility where your surgery was performed. IF YOU HAVE DISABILITY OR FAMILY LEAVE FORMS, YOU MUST BRING THEM TO THE OFFICE FOR PROCESSING.   DO NOT GIVE THEM TO YOUR DOCTOR.  1. A  prescription for pain medication  may be given to you upon discharge.  Take your pain medication as prescribed, if needed.  If narcotic pain medicine is not needed, then you may take acetaminophen (Tylenol) or ibuprofen (Advil) as needed. 2. Take your usually prescribed medications unless otherwise directed. If you need a refill on your pain medication, please contact your pharmacy.  They will contact our office to request authorization. Prescriptions will not be filled after 5 pm or on week-ends. 3. You should follow a light diet the first 24 hours after arrival home, such as soup and crackers, etc.  Be sure to include lots of fluids daily.  Resume your normal diet the day after surgery. 4.Most patients will experience some swelling and bruising around the umbilicus or in the groin and scrotum.  Ice packs and reclining will help.  Swelling and bruising can take several days to resolve.  6. It is common to experience some constipation if taking pain medication after surgery.  Increasing fluid intake and taking a stool softener (such as Colace) will usually help or prevent this problem from occurring.  A mild laxative (Milk of Magnesia or Miralax) should be taken according to package directions if there are no bowel movements after 48 hours. 7. Unless discharge instructions indicate otherwise, you may remove your bandages 24-48 hours after surgery, and you may shower at that time.  You may have steri-strips (small skin tapes) in place directly over the incision.  These strips should be left on the skin for 7-10 days.  If your surgeon used skin glue on the incision, you may shower in 24 hours.  The  glue will flake off over the next 2-3 weeks.  Any sutures or staples will be removed at the office during your follow-up visit. 8. ACTIVITIES:  You may resume regular (light) daily activities beginning the next day--such as daily self-care, walking, climbing stairs--gradually increasing activities as tolerated.  You may have sexual intercourse when  it is comfortable.  Refrain from any heavy lifting or straining until approved by your doctor.  a.You may drive when you are no longer taking prescription pain medication, you can comfortably wear a seatbelt, and you can safely maneuver your car and apply brakes. b.RETURN TO WORK:   _____________________________________________  9.You should see your doctor in the office for a follow-up appointment approximately 2-3 weeks after your surgery.  Make sure that you call for this appointment within a day or two after you arrive home to insure a convenient appointment time. 10.OTHER INSTRUCTIONS: __OK TO SHOWER STARTING TOMORROW ICE PACK, TYLENOL, AND IBUPROFEN ALSO FOR PAIN NO LIFTING MORE THAN 15 POUNDS FOR 4 WEEKS_______________________    _____________________________________  WHEN TO CALL YOUR DOCTOR: 1. Fever over 101.0 2. Inability to urinate 3. Nausea and/or vomiting 4. Extreme swelling or bruising 5. Continued bleeding from incision. 6. Increased pain, redness, or drainage from the incision  The clinic staff is available to answer your questions during regular business hours.  Please don't hesitate to call and ask to speak to one of the nurses for clinical concerns.  If you have a medical emergency, go to the nearest emergency room or call 911.  A surgeon from [facility] is always on call at the hospital   [address], [address], [place], [place]  [address] ?  [address] [address], [place], [place]   [address] [phone] ? [phone] ? FAX [phone] Web site: [email or web address]

## 2020-03-01 NOTE — Anesthesia Postprocedure Evaluation (Signed)
Anesthesia Post Note  Patient: Tiffany [name]  Procedure(s) Performed: HERNIA REPAIR UMBILICAL WITH MESH (N/A Abdomen) INSERTION OF MESH (N/A Abdomen)     Patient location during evaluation: PACU Anesthesia Type: General Level of consciousness: awake and alert Pain management: pain level controlled Vital Signs Assessment: post-procedure vital signs reviewed and stable Respiratory status: spontaneous breathing, nonlabored ventilation, respiratory function stable and patient connected to nasal cannula oxygen Cardiovascular status: blood pressure returned to baseline and stable Postop Assessment: no apparent nausea or vomiting Anesthetic complications: no   No complications documented.  Last Vitals:  Vitals:   03/01/20 1030 03/01/20 1052  BP: 118/74 118/83  Pulse: 86 68  Resp: 16 18  Temp:  36.7 C  SpO2: 100% 100%    Last Pain:  Vitals:   03/01/20 1052  TempSrc:   PainSc: 0-No pain                 Earl Lites P Marlys Stegmaier

## 2020-03-01 NOTE — Transfer of Care (Signed)
Immediate Anesthesia Transfer of Care Note  Patient: Chela L Letson  Procedure(s) Performed: HERNIA REPAIR UMBILICAL WITH MESH (N/A Abdomen) INSERTION OF MESH (N/A Abdomen)  Patient Location: PACU  Anesthesia Type:General  Level of Consciousness: awake and drowsy  Airway & Oxygen Therapy: Patient Spontanous Breathing and Patient connected to face mask oxygen  Post-op Assessment: Report given to RN and Post -op Vital signs reviewed and stable  Post vital signs: Reviewed and unstable  Last Vitals:  Vitals Value Taken Time  BP 99/61 03/01/20 0946  Temp    Pulse 89 03/01/20 0949  Resp 13 03/01/20 0949  SpO2 100 % 03/01/20 0949  Vitals shown include unvalidated device data.  Last Pain:  Vitals:   03/01/20 0810  TempSrc: Oral  PainSc: 0-No pain         Complications: No complications documented.

## 2020-03-01 NOTE — Op Note (Signed)
HERNIA REPAIR UMBILICAL WITH MESH, INSERTION OF MESH  Procedure Note  Tiffany [name] 03/01/2020   Pre-op Diagnosis: UMBILICAL HERNIA     Post-op Diagnosis: same  Procedure(s): HERNIA REPAIR UMBILICAL WITH MESH INSERTION OF MESH  Surgeon(s): Abigail Miyamoto, MD  Anesthesia: General  Staff:  Circulator: Maryan Rued, RN Relief Circulator: Salley Scarlet, RN Scrub Person: Barrington Ellison, RN  Estimated Blood Loss: Minimal               Findings: The patient had a moderate sized umbilical hernia which was repaired with a 4.3 cm round ventral Prolene patch from [facility]  Procedure: The patient was brought to the operating room and identified as the correct patient.  She is placed upon the operating table general anesthesia was induced.  Her abdomen was prepped and draped in the usual sterile fashion.  I anesthetized the skin of the lower edge of the umbilicus with Marcaine and then made a semicircular incision with a scalpel.  I took this down to the fascial defect which was just under the umbilical skin.  I separated the skin from the hernia sac and excised the sac.  It was a moderate sized fascial defect.  I brought a 4.3 cm round ventral Prolene patch from [facility] onto the field and placed it through the fascia opening.  It was then pulled up against the peritoneum.  I then sutured in place with interrupted 0 Novafil sutures.  I then cut the stay ties and closed the fascia over the top of the mesh with several figure-of-eight 0 Novafil sutures.  I anesthetized the fascia further with Marcaine.  Hemostasis appeared to be achieved.  I then tacked the umbilical skin back in place with a 3-0 Vicryl suture.  I then closed the subcutaneous tissue with interrupted 3-0 Vicryl sutures and closed the skin with a running 4-0 Monocryl.  Dermabond was then applied.  The patient tolerated the procedure well.  All the counts were correct at the end of the procedure.  The patient was then extubated in  the operating room and taken in a stable condition to the recovery room.          Abigail Miyamoto   Date: 03/01/2020  Time: 9:42 AM

## 2020-03-01 NOTE — Interval H&P Note (Signed)
History and Physical Interval Note:no change in H and P  03/01/2020 7:57 AM  Tiffany [name]  has presented today for surgery, with the diagnosis of UMBILICAL HERNIA.  The various methods of treatment have been discussed with the patient and family. After consideration of risks, benefits and other options for treatment, the patient has consented to  Procedure(s): HERNIA REPAIR UMBILICAL WITH POSSIBLE MESH (N/A) as a surgical intervention.  The patient's history has been reviewed, patient examined, no change in status, stable for surgery.  I have reviewed the patient's chart and labs.  Questions were answered to the patient's satisfaction.     Abigail Miyamoto

## 2020-03-02 ENCOUNTER — Encounter (HOSPITAL_BASED_OUTPATIENT_CLINIC_OR_DEPARTMENT_OTHER): Payer: Self-pay | Admitting: Surgery

## 2020-03-12 ENCOUNTER — Encounter (INDEPENDENT_AMBULATORY_CARE_PROVIDER_SITE_OTHER): Payer: Self-pay | Admitting: Primary Care

## 2020-03-15 ENCOUNTER — Ambulatory Visit (INDEPENDENT_AMBULATORY_CARE_PROVIDER_SITE_OTHER): Payer: Managed Care, Other (non HMO) | Admitting: Primary Care

## 2020-03-15 ENCOUNTER — Other Ambulatory Visit: Payer: Self-pay

## 2020-03-15 ENCOUNTER — Encounter (INDEPENDENT_AMBULATORY_CARE_PROVIDER_SITE_OTHER): Payer: Self-pay | Admitting: Primary Care

## 2020-03-15 VITALS — BP 145/98 | HR 107 | Temp 98.2°F | Ht 66.0 in | Wt 161.4 lb

## 2020-03-15 DIAGNOSIS — R7303 Prediabetes: Secondary | ICD-10-CM

## 2020-03-15 DIAGNOSIS — R030 Elevated blood-pressure reading, without diagnosis of hypertension: Secondary | ICD-10-CM

## 2020-03-15 DIAGNOSIS — R42 Dizziness and giddiness: Secondary | ICD-10-CM | POA: Diagnosis not present

## 2020-03-15 DIAGNOSIS — E559 Vitamin D deficiency, unspecified: Secondary | ICD-10-CM

## 2020-03-15 DIAGNOSIS — D6862 Lupus anticoagulant syndrome: Secondary | ICD-10-CM

## 2020-03-15 DIAGNOSIS — F32A Depression, unspecified: Secondary | ICD-10-CM | POA: Diagnosis not present

## 2020-03-15 MED ORDER — ESCITALOPRAM OXALATE 10 MG PO TABS: 10.0000 mg | ORAL_TABLET | Freq: Every day | ORAL | 0 refills | Status: DC

## 2020-03-15 NOTE — Patient Instructions (Signed)

## 2020-03-15 NOTE — Progress Notes (Signed)
Established Patient Office Visit  Subjective:  Patient ID: Tiffany [name], female    DOB: 11-21-1985  Age: 34 y.o. MRN: [id number]  CC:  Chief Complaint  Patient presents with   Depression    HPI Ms. Ashlee L Bove presents for depression, increase fatigue, body aches, hands turning colors history of elevated sed rate  Past Medical History:  Diagnosis Date   Anemia 2007   HGB 11.04 Feb 2009.   Asthma    Deep vein thrombosis (DVT) (HCC)    left leg   Hernia, umbilical    plans for repair soon   Lupus anticoagulant disorder ([place]) 11/07   Detected   Missed abortion 1/10   SP D&C   Preterm labor    SAB (spontaneous abortion) 1/09    Past Surgical History:  Procedure Laterality Date   CESAREAN SECTION  2004,2007   CESAREAN SECTION  11/20/2010   Procedure: CESAREAN SECTION;  Surgeon: Shelly Bombard, MD;  Location: [facility];  Service: Gynecology;  Laterality: N/A;  repeat cesarean section    CESAREAN SECTION  11/15/2011   Procedure: CESAREAN SECTION;  Surgeon: Frederico Hamman, MD;  Location: [facility];  Service: Gynecology;  Laterality: N/A;   DILATION AND CURETTAGE OF UTERUS  1/10   For missed AB   HYSTEROSCOPY WITH D & C N/A 10/15/2013   Procedure: HYSTEROSCOPY/DILATATION AND CURETTAGE;  Surgeon: Frederico Hamman, MD;  Location: [facility];  Service: Gynecology;  Laterality: N/A;   INSERTION OF MESH N/A 03/01/2020   Procedure: INSERTION OF MESH;  Surgeon: Coralie Keens, MD;  Location: [facility];  Service: General;  Laterality: N/A;   TONSILLECTOMY     UMBILICAL HERNIA REPAIR N/A 03/01/2020   Procedure: HERNIA REPAIR UMBILICAL WITH MESH;  Surgeon: Coralie Keens, MD;  Location: [facility];  Service: General;  Laterality: N/A;    Family History  Problem Relation Age of Onset   Hyperlipidemia Mother    Hypertension Mother    Arthritis Mother    Hypertension Father    Hyperlipidemia Father    Diabetes Father     Stroke Father    Cataracts Father    Other Neg Hx     Social History   Socioeconomic History   Marital status: Married    Spouse name: Not on file   Number of children: Not on file   Years of education: Not on file   Highest education level: Not on file  Occupational History   Occupation: CNA  Tobacco Use   Smoking status: Never Smoker   Smokeless tobacco: Never Used  Scientific laboratory technician Use: Never used  Substance and Sexual Activity   Alcohol use: No   Drug use: No   Sexual activity: Yes    Birth control/protection: Surgical  Other Topics Concern   Not on file  Social History Narrative   Works as a Quarry manager, married, gets regular exercise, lives in [place] with husband/kids.    Social Determinants of Health   Financial Resource Strain:    Difficulty of Paying Living Expenses: Not on file  Food Insecurity:    Worried About Charity fundraiser in the Last Year: Not on file   [employer] of Food in the Last Year: Not on file  Transportation Needs:    Lack of Transportation (Medical): Not on file   Lack of Transportation (Non-Medical): Not on file  Physical Activity:    Days of Exercise per Week: Not on  file   Minutes of Exercise per Session: Not on file  Stress:    Feeling of Stress : Not on file  Social Connections:    Frequency of Communication with Friends and Family: Not on file   Frequency of Social Gatherings with Friends and Family: Not on file   Attends Religious Services: Not on file   Active Member of Clubs or Organizations: Not on file   Attends Archivist Meetings: Not on file   Marital Status: Not on file  Intimate Partner Violence:    Fear of Current or Ex-Partner: Not on file   Emotionally Abused: Not on file   Physically Abused: Not on file   Sexually Abused: Not on file    Outpatient Medications Prior to Visit  Medication Sig Dispense Refill   rivaroxaban (XARELTO) 20 MG TABS tablet Take 1 tablet (20 mg  total) by mouth daily after lunch. 30 tablet 3   oxyCODONE (OXY IR/ROXICODONE) 5 MG immediate release tablet Take 1 tablet (5 mg total) by mouth every 6 (six) hours as needed for moderate pain or severe pain. 25 tablet 0   No facility-administered medications prior to visit.    No Known Allergies  ROS Review of Systems  Constitutional: Positive for fatigue.  Gastrointestinal: Positive for abdominal pain.       Hernia repair  Musculoskeletal: Positive for arthralgias.  Psychiatric/Behavioral:       Depressed  All other systems reviewed and are negative.     Objective:    Physical Exam Vitals reviewed.  HENT:     Head: Normocephalic.     Nose: Nose normal.  Cardiovascular:     Rate and Rhythm: Normal rate and regular rhythm.  Pulmonary:     Effort: Pulmonary effort is normal.     Breath sounds: Normal breath sounds.  Abdominal:     General: Bowel sounds are normal.  Musculoskeletal:        General: Normal range of motion.     Cervical back: Normal range of motion.  Skin:    General: Skin is warm and dry.  Neurological:     Mental Status: She is alert and oriented to person, place, and time.  Psychiatric:        Mood and Affect: Mood normal.        Behavior: Behavior normal.        Thought Content: Thought content normal.        Judgment: Judgment normal.     BP (!) 145/98 (BP Location: Right Arm, Patient Position: Sitting, Cuff Size: Normal)    Pulse (!) 107    Temp 98.2 F (36.8 C) (Temporal)    Ht '5\' 6"'  (1.676 m)    Wt 161 lb 6.4 oz (73.2 kg)    LMP 03/01/2020 (Exact Date)    SpO2 96%    BMI 26.05 kg/m  Wt Readings from Last 3 Encounters:  03/15/20 161 lb 6.4 oz (73.2 kg)  03/01/20 160 lb 11.5 oz (72.9 kg)  11/14/19 167 lb 9.6 oz (76 kg)     Health Maintenance Due  Topic Date Due   Hepatitis C Screening  Never done   INFLUENZA VACCINE  Never done    There are no preventive care reminders to display for this patient.  Lab Results  Component Value  Date   TSH 0.72 07/20/2016   Lab Results  Component Value Date   WBC 8.8 01/29/2019   HGB 12.5 01/29/2019   HCT 37.3 01/29/2019  MCV 86 01/29/2019   PLT 422 01/29/2019   Lab Results  Component Value Date   NA 141 01/29/2019   K 4.6 01/29/2019   CO2 22 01/29/2019   GLUCOSE 83 01/29/2019   BUN 8 01/29/2019   CREATININE 0.93 01/29/2019   BILITOT 0.2 01/29/2019   ALKPHOS 49 01/29/2019   AST 18 01/29/2019   ALT 17 01/29/2019   PROT 7.4 01/29/2019   ALBUMIN 4.7 01/29/2019   CALCIUM 9.9 01/29/2019   ANIONGAP 10 07/22/2016   Lab Results  Component Value Date   CHOL 99 (L) 08/26/2015   Lab Results  Component Value Date   HDL 53 08/26/2015   Lab Results  Component Value Date   LDLCALC 40 08/26/2015   Lab Results  Component Value Date   TRIG 31 08/26/2015   Lab Results  Component Value Date   CHOLHDL 1.9 08/26/2015   Lab Results  Component Value Date   HGBA1C 5.7 (H) 01/29/2019      Assessment & Plan:  Majesty was seen today for depression.  Diagnoses and all orders for this visit:  Depression, unspecified depression type Started low dose SSRI Lexapro 37m at bedtime she does not want treatment outside of [facility]  Vitamin D deficiency -     Vitamin D, 25-hydroxy  Lupus anticoagulant disorder (HCC) -     Sedimentation Rate -     Rheumatoid Arthritis Profile  Episodic lightheadedness -     CBC with Differential  Elevated BP without diagnosis of hypertension -     CMP14+EGFR  Prediabetes -     Hemoglobin A1c 5.7  Other orders -     escitalopram (LEXAPRO) 10 MG tablet; Take 1 tablet (10 mg total) by mouth daily.    Meds ordered this encounter  Medications   escitalopram (LEXAPRO) 10 MG tablet    Sig: Take 1 tablet (10 mg total) by mouth daily.    Dispense:  60 tablet    Refill:  0    Follow-up: Return in about 7 weeks (around 05/05/2020) for Medication follow up and BP.    MKerin Perna NP

## 2020-03-16 LAB — HEMOGLOBIN A1C
Est. average glucose Bld gHb Est-mCnc: 117 mg/dL
Hgb A1c MFr Bld: 5.7 % — ABNORMAL HIGH (ref 4.8–5.6)

## 2020-03-17 LAB — CMP14+EGFR
ALT: 15 IU/L (ref 0–32)
AST: 18 IU/L (ref 0–40)
Albumin/Globulin Ratio: 1.4 (ref 1.2–2.2)
Albumin: 4.5 g/dL (ref 3.8–4.8)
Alkaline Phosphatase: 52 IU/L (ref 44–121)
BUN/Creatinine Ratio: 7 — ABNORMAL LOW (ref 9–23)
BUN: 6 mg/dL (ref 6–20)
Bilirubin Total: 0.2 mg/dL (ref 0.0–1.2)
CO2: 27 mmol/L (ref 20–29)
Calcium: 9.8 mg/dL (ref 8.7–10.2)
Chloride: 106 mmol/L (ref 96–106)
Creatinine, Ser: 0.88 mg/dL (ref 0.57–1.00)
GFR calc Af Amer: 98 mL/min/{1.73_m2} (ref >59)
GFR calc non Af Amer: 85 mL/min/{1.73_m2} (ref >59)
Globulin, Total: 3.2 g/dL (ref 1.5–4.5)
Glucose: 96 mg/dL (ref 65–99)
Potassium: 4.4 mmol/L (ref 3.5–5.2)
Sodium: 142 mmol/L (ref 134–144)
Total Protein: 7.7 g/dL (ref 6.0–8.5)

## 2020-03-17 LAB — CBC WITH DIFFERENTIAL/PLATELET
Basophils Absolute: 0.1 10*3/uL (ref 0.0–0.2)
Basos: 1 %
EOS (ABSOLUTE): 0.2 10*3/uL (ref 0.0–0.4)
Eos: 2 %
Hematocrit: 36.6 % (ref 34.0–46.6)
Hemoglobin: 12.5 g/dL (ref 11.1–15.9)
Immature Grans (Abs): 0 10*3/uL (ref 0.0–0.1)
Immature Granulocytes: 0 %
Lymphocytes Absolute: 4.3 10*3/uL — ABNORMAL HIGH (ref 0.7–3.1)
Lymphs: 48 %
MCH: 29.9 pg (ref 26.6–33.0)
MCHC: 34.2 g/dL (ref 31.5–35.7)
MCV: 88 fL (ref 79–97)
Monocytes Absolute: 0.8 10*3/uL (ref 0.1–0.9)
Monocytes: 8 %
Neutrophils Absolute: 3.7 10*3/uL (ref 1.4–7.0)
Neutrophils: 41 %
Platelets: 448 10*3/uL (ref 150–450)
RBC: 4.18 x10E6/uL (ref 3.77–5.28)
RDW: 12.5 % (ref 11.7–15.4)
WBC: 9.1 10*3/uL (ref 3.4–10.8)

## 2020-03-17 LAB — RHEUMATOID ARTHRITIS PROFILE
Cyclic Citrullin Peptide Ab: 11 units (ref 0–19)
Rheumatoid fact SerPl-aCnc: <10 IU/mL (ref 0.0–13.9)

## 2020-03-17 LAB — SEDIMENTATION RATE: Sed Rate: 2 mm/hr (ref 0–32)

## 2020-03-17 LAB — VITAMIN D 25 HYDROXY (VIT D DEFICIENCY, FRACTURES): Vit D, 25-Hydroxy: 22.9 ng/mL — ABNORMAL LOW (ref 30.0–100.0)

## 2020-05-21 ENCOUNTER — Telehealth (INDEPENDENT_AMBULATORY_CARE_PROVIDER_SITE_OTHER): Payer: Managed Care, Other (non HMO) | Admitting: Primary Care

## 2021-05-14 ENCOUNTER — Encounter: Payer: Self-pay | Admitting: Emergency Medicine

## 2021-05-14 ENCOUNTER — Ambulatory Visit
Admission: EM | Admit: 2021-05-14 | Discharge: 2021-05-14 | Disposition: A | Discharge status: Home / Self Care | Payer: Managed Care, Other (non HMO) | Attending: Internal Medicine | Admitting: Internal Medicine

## 2021-05-14 ENCOUNTER — Other Ambulatory Visit: Payer: Self-pay

## 2021-05-14 DIAGNOSIS — R220 Localized swelling, mass and lump, head: Secondary | ICD-10-CM

## 2021-05-14 MED ORDER — AMOXICILLIN-POT CLAVULANATE 875-125 MG PO TABS: 1.0000 | ORAL_TABLET | Freq: Two times a day (BID) | ORAL | 0 refills | Status: DC

## 2021-05-14 NOTE — ED Provider Notes (Signed)
[facility]    CSN: [id number] Arrival date & time: 05/14/21  1436      History   Chief Complaint Chief Complaint  Patient presents with   Facial Swelling    HPI Tiffany [name] is a 36 y.o. female.   Patient presents with right-sided jaw swelling that has been present for approximately 2 days.  She also has associated pain to the area.  Denies any injury to the area.  Denies any dental pain.  Denies any fevers.  Denies ear pain, upper respiratory symptoms, cough.  Denies history of the same.  Patient has not yet taken any medications to help alleviate symptoms.  Denies shortness of breath or difficulty swallowing.    Past Medical History:  Diagnosis Date   Anemia 2007   HGB 11.04 Feb 2009.   Asthma    Deep vein thrombosis (DVT) (HCC)    left leg   Hernia, umbilical    plans for repair soon   Lupus anticoagulant disorder ([place]) 11/07   Detected   Missed abortion 1/10   SP D&C   Preterm labor    SAB (spontaneous abortion) 1/09    Patient Active Problem List   Diagnosis Date Noted   Vitamin D deficiency 07/21/2016   Paresthesias/numbness 07/20/2016   Memory problem 07/20/2016   Generalized abdominal pain 07/20/2016   Chronic anticoagulation 07/20/2016   History of sleep walking 07/20/2016   Abnormal MMSE 07/20/2016   Personal history of noncompliance with medical treatment, presenting hazards to health 07/20/2016   History of abnormal cervical Pap smear 08/25/2015   Asthma, mild intermittent 08/25/2015   Previous cesarean section 11/20/2010   SROM (spontaneous rupture of membranes) 11/20/2010   Early or threatened labor 11/20/2010   Anemia 02/18/2009   PRIMARY HYPERCOAGULABLE STATE 02/18/2009   Acute thromboembolism of deep veins of lower extremity ([place]) 02/18/2009   MISSED ABORTION 02/18/2009   ABORTION, SPONTANEOUS 02/18/2009   Lupus anticoagulant disorder ([place]) 03/01/2006    Past Surgical History:  Procedure Laterality Date   CESAREAN  SECTION  [id number]   CESAREAN SECTION  11/20/2010   Procedure: CESAREAN SECTION;  Surgeon: Shelly Bombard, MD;  Location: [facility];  Service: Gynecology;  Laterality: N/A;  repeat cesarean section    CESAREAN SECTION  11/15/2011   Procedure: CESAREAN SECTION;  Surgeon: Frederico Hamman, MD;  Location: [facility];  Service: Gynecology;  Laterality: N/A;   DILATION AND CURETTAGE OF UTERUS  1/10   For missed AB   HYSTEROSCOPY WITH D & C N/A 10/15/2013   Procedure: HYSTEROSCOPY/DILATATION AND CURETTAGE;  Surgeon: Frederico Hamman, MD;  Location: [facility];  Service: Gynecology;  Laterality: N/A;   INSERTION OF MESH N/A 03/01/2020   Procedure: INSERTION OF MESH;  Surgeon: Coralie Keens, MD;  Location: [facility];  Service: General;  Laterality: N/A;   TONSILLECTOMY     UMBILICAL HERNIA REPAIR N/A 03/01/2020   Procedure: HERNIA REPAIR UMBILICAL WITH MESH;  Surgeon: Coralie Keens, MD;  Location: [facility];  Service: General;  Laterality: N/A;    OB History     Gravida  8   Para  4   Term  2   Preterm  2   AB  4   Living  4      SAB  4   IAB      Ectopic      Multiple      Live Births  2  Home Medications    Prior to Admission medications   Medication Sig Start Date End Date Taking? Authorizing Provider  amoxicillin-clavulanate (AUGMENTIN) 875-125 MG tablet Take 1 tablet by mouth every 12 (twelve) hours. 05/14/21  Yes Simrat Kendrick, Hildred Alamin E, FNP  escitalopram (LEXAPRO) 10 MG tablet Take 1 tablet (10 mg total) by mouth daily. 03/15/20   Kerin Perna, NP  rivaroxaban (XARELTO) 20 MG TABS tablet Take 1 tablet (20 mg total) by mouth daily after lunch. 01/29/19   Kerin Perna, NP    Family History Family History  Problem Relation Age of Onset   Hyperlipidemia Mother    Hypertension Mother    Arthritis Mother    Hypertension Father    Hyperlipidemia Father    Diabetes Father    Stroke Father    Cataracts Father     Other Neg Hx     Social History Social History   Tobacco Use   Smoking status: Never   Smokeless tobacco: Never  Vaping Use   Vaping Use: Never used  Substance Use Topics   Alcohol use: No   Drug use: No     Allergies   Patient has no known allergies.   Review of Systems Review of Systems Per HPI  Physical Exam Triage Vital Signs ED Triage Vitals [05/14/21 1454]  Enc Vitals Group     BP 128/85     Pulse Rate (!) 109     Resp 16     Temp 97.9 F (36.6 C)     Temp Source Oral     SpO2 98 %     Weight      Height      Head Circumference      Peak Flow      Pain Score 5     Pain Loc      Pain Edu?      Excl. in [place]?    No data found.  Updated Vital Signs BP 128/85 (BP Location: Left Arm)    Pulse (!) 109    Temp 97.9 F (36.6 C) (Oral)    Resp 16    SpO2 98%   Visual Acuity Right Eye Distance:   Left Eye Distance:   Bilateral Distance:    Right Eye Near:   Left Eye Near:    Bilateral Near:     Physical Exam Constitutional:      General: She is not in acute distress.    Appearance: Normal appearance. She is not toxic-appearing or diaphoretic.  HENT:     Head: Normocephalic and atraumatic.      Comments: Mild swelling noted directly in front of ear that extends slightly down jaw.  No erythema or bruising noted.  No purulent drainage.  Patient is able to move jaw and open mouth.  There is no obvious dental abscess or dental infection inside mouth.  No infection inside mouth noted at all.    Mouth/Throat:     Lips: Pink.     Mouth: Mucous membranes are moist.     Dentition: Normal dentition. No dental tenderness, gingival swelling, dental caries or dental abscesses.  Eyes:     Extraocular Movements: Extraocular movements intact.     Conjunctiva/sclera: Conjunctivae normal.  Pulmonary:     Effort: Pulmonary effort is normal.  Neurological:     General: No focal deficit present.     Mental Status: She is alert and oriented to person, place, and  time. Mental status is at baseline.  Psychiatric:  Mood and Affect: Mood normal.        Behavior: Behavior normal.        Thought Content: Thought content normal.        Judgment: Judgment normal.     UC Treatments / Results  Labs (all labs ordered are listed, but only abnormal results are displayed) Labs Reviewed - No data to display  EKG   Radiology No results found.  Procedures Procedures (including critical care time)  Medications Ordered in UC Medications - No data to display  Initial Impression / Assessment and Plan / UC Course  I have reviewed the triage vital signs and the nursing notes.  Pertinent labs & imaging results that were available during my care of the patient were reviewed by me and considered in my medical decision making (see chart for details).     Differential diagnoses include preauricular lymph node swelling or parotitis.  Will prescribe Augmentin antibiotic to help treat this.  Discussed strict ER precautions.  Patient was advised to go to the hospital if no improvement in symptoms in the next 24 to 48 hours.  There is no fever present and vital signs are stable so do not think the patient is in need of immediate medical attention at the hospital at this time.  Patient voiced understanding and was agreeable with plan. Final Clinical Impressions(s) / UC Diagnoses   Final diagnoses:  Right facial swelling     Discharge Instructions      You have been prescribed  an antibiotic.  Please go to the hospital if no improvement in symptoms in the next 24 to 48 hours.    ED Prescriptions     Medication Sig Dispense Auth. Provider   amoxicillin-clavulanate (AUGMENTIN) 875-125 MG tablet Take 1 tablet by mouth every 12 (twelve) hours. 14 tablet [place], Michele Rockers, [place]      PDMP not reviewed this encounter.   Teodora Medici, [place] 05/14/21 1534

## 2021-05-14 NOTE — Discharge Instructions (Signed)
You have been prescribed  an antibiotic.  Please go to the hospital if no improvement in symptoms in the next 24 to 48 hours.

## 2021-05-14 NOTE — ED Triage Notes (Signed)
Right sided jaw swelling starting two days prior. Denies poor dentition.

## 2021-05-19 ENCOUNTER — Emergency Department (HOSPITAL_BASED_OUTPATIENT_CLINIC_OR_DEPARTMENT_OTHER): Payer: Self-pay

## 2021-05-19 ENCOUNTER — Other Ambulatory Visit: Payer: Self-pay

## 2021-05-19 ENCOUNTER — Encounter (HOSPITAL_BASED_OUTPATIENT_CLINIC_OR_DEPARTMENT_OTHER): Payer: Self-pay

## 2021-05-19 ENCOUNTER — Emergency Department (HOSPITAL_BASED_OUTPATIENT_CLINIC_OR_DEPARTMENT_OTHER)
Admission: EM | Admit: 2021-05-19 | Discharge: 2021-05-19 | Disposition: A | Discharge status: Home / Self Care | Payer: Self-pay | Attending: Emergency Medicine | Admitting: Emergency Medicine

## 2021-05-19 DIAGNOSIS — Z7952 Long term (current) use of systemic steroids: Secondary | ICD-10-CM | POA: Insufficient documentation

## 2021-05-19 DIAGNOSIS — K1120 Sialoadenitis, unspecified: Secondary | ICD-10-CM | POA: Insufficient documentation

## 2021-05-19 LAB — BASIC METABOLIC PANEL
Anion gap: 10 (ref 5–15)
BUN: 6 mg/dL (ref 6–20)
CO2: 24 mmol/L (ref 22–32)
Calcium: 9.4 mg/dL (ref 8.9–10.3)
Chloride: 104 mmol/L (ref 98–111)
Creatinine, Ser: 0.84 mg/dL (ref 0.44–1.00)
GFR, Estimated: >60 mL/min (ref >60)
Glucose, Bld: 95 mg/dL (ref 70–99)
Potassium: 3.6 mmol/L (ref 3.5–5.1)
Sodium: 138 mmol/L (ref 135–145)

## 2021-05-19 LAB — CBC
HCT: 39.1 % (ref 36.0–46.0)
Hemoglobin: 13 g/dL (ref 12.0–15.0)
MCH: 29.5 pg (ref 26.0–34.0)
MCHC: 33.2 g/dL (ref 30.0–36.0)
MCV: 88.7 fL (ref 80.0–100.0)
Platelets: 400 10*3/uL (ref 150–400)
RBC: 4.41 MIL/uL (ref 3.87–5.11)
RDW: 12.6 % (ref 11.5–15.5)
WBC: 11 10*3/uL — ABNORMAL HIGH (ref 4.0–10.5)
nRBC: 0 % (ref 0.0–0.2)

## 2021-05-19 MED ORDER — PREDNISONE 10 MG (21) PO TBPK: ORAL_TABLET | Freq: Every day | ORAL | 0 refills | Status: DC

## 2021-05-19 MED ORDER — IOHEXOL 300 MG/ML  SOLN
100.0000 mL | Freq: Once | INTRAMUSCULAR | Status: AC | PRN
  Administered 2021-05-19: 75 mL via INTRAVENOUS

## 2021-05-19 MED ORDER — KETOROLAC TROMETHAMINE 15 MG/ML IJ SOLN
15.0000 mg | Freq: Once | INTRAMUSCULAR | Status: AC
Start: 2021-05-19 — End: 2021-05-19
  Administered 2021-05-19: 15 mg via INTRAMUSCULAR
  Filled 2021-05-19: qty 1

## 2021-05-19 NOTE — Discharge Instructions (Addendum)
Please continue taking antibiotics as prescribed.  Start your steroids tomorrow morning and do not take them late in the day as this can likely keep you up at night.  Please return to the emergency department for worsening symptoms.

## 2021-05-19 NOTE — ED Provider Notes (Signed)
[facility] EMERGENCY DEPT Provider Note   CSN: [id number] Arrival date & time: 05/19/21  1543     History Chief Complaint  Patient presents with   Facial Swelling    Tiffany [name] is a 36 y.o. female who presents the emergency department with right-sided facial swelling has been ongoing for the last 10 days.  Patient states that she was initially seen evaluated urgent care and diagnosed with parotitis and placed on Augmentin.  She was told at that time that if she did not get better 72 hours to get reevaluated.  She been taking the Augmentin as prescribed for the last 5 days with minimal improvement.  She still having significant pain.  She reports associated subjective fever and chills.  She has been using lemon water and tart candies with little improvement.  HPI     Home Medications Prior to Admission medications   Medication Sig Start Date End Date Taking? Authorizing Provider  predniSONE (STERAPRED UNI-PAK 21 TAB) 10 MG (21) TBPK tablet Take by mouth daily. Take 6 tabs by mouth daily  for 2 days, then 5 tabs for 2 days, then 4 tabs for 2 days, then 3 tabs for 2 days, 2 tabs for 2 days, then 1 tab by mouth daily for 2 days 05/19/21  Yes Meredeth Ide, Shyhiem Beeney M, PA-C  amoxicillin-clavulanate (AUGMENTIN) 875-125 MG tablet Take 1 tablet by mouth every 12 (twelve) hours. 05/14/21   Gustavus Bryant, FNP  escitalopram (LEXAPRO) 10 MG tablet Take 1 tablet (10 mg total) by mouth daily. 03/15/20   Grayce Sessions, NP  rivaroxaban (XARELTO) 20 MG TABS tablet Take 1 tablet (20 mg total) by mouth daily after lunch. 01/29/19   Grayce Sessions, NP      Allergies    Patient has no known allergies.    Review of Systems   Review of Systems  All other systems reviewed and are negative.  Physical Exam Updated Vital Signs BP 119/89 (BP Location: Right Arm)    Pulse 73    Temp 98.3 F (36.8 C) (Oral)    Resp 16    Ht 5\' 6"  (1.676 m)    Wt 73.9 kg    LMP 04/19/2021    SpO2 100%     BMI 26.31 kg/m  Physical Exam Vitals and nursing note reviewed.  Constitutional:      Appearance: Normal appearance.  HENT:     Head: Normocephalic. Raccoon eyes present. No Battle's sign.     Comments: Firmness over the right parotid gland with significant tenderness.  There is mild right-sided facial swelling.    Right Ear: Tympanic membrane, ear canal and external ear normal.     Left Ear: Tympanic membrane, ear canal and external ear normal.     Mouth/Throat:     Mouth: Mucous membranes are moist.     Comments: Posterior pharynx is normal.  Tonsils normal.  Uvula midline. Eyes:     General:        Right eye: No discharge.        Left eye: No discharge.     Conjunctiva/sclera: Conjunctivae normal.  Pulmonary:     Effort: Pulmonary effort is normal.  Skin:    General: Skin is warm and dry.     Findings: No rash.  Neurological:     General: No focal deficit present.     Mental Status: She is alert.  Psychiatric:        Mood and Affect: Mood normal.  Behavior: Behavior normal.    ED Results / Procedures / Treatments   Labs (all labs ordered are listed, but only abnormal results are displayed) Labs Reviewed  CBC - Abnormal; Notable for the following components:      Result Value   WBC 11.0 (*)    All other components within normal limits  BASIC METABOLIC PANEL    EKG None  Radiology CT Maxillofacial W Contrast  Result Date: 05/19/2021 CLINICAL DATA:  Right parotitis EXAM: CT MAXILLOFACIAL WITH CONTRAST TECHNIQUE: Multidetector CT imaging of the maxillofacial structures was performed with intravenous contrast. Multiplanar CT image reconstructions were also generated. RADIATION DOSE REDUCTION: This exam was performed according to the departmental dose-optimization program which includes automated exposure control, adjustment of the mA and/or kV according to patient size and/or use of iterative reconstruction technique. CONTRAST:  75mL OMNIPAQUE IOHEXOL 300 MG/ML   SOLN COMPARISON:  None. FINDINGS: Osseous: No fracture or mandibular dislocation. No destructive process. Orbits: Negative. No traumatic or inflammatory finding. Sinuses: Clear. Soft tissues: Hyperenhancement and mild enlargement of the right parotid gland. Normal left parotid gland. Within the right parotid gland, there are multiple small calcifications, but no evidence of low-density collection. No ductal dilatation. Normal appearance of the submandibular glands. Prominent right level 1B lymph nodes, measuring up to 7 mm in short axis (series 2, image 26. Prominent right level 2A lymph node measures 1 cm in short axis (series 2, image 37). Prominent right level 2B lymph node measures up to 8 mm in short axis (series 2, image 40). These lymph nodes retain normal reniform morphology Limited intracranial: No significant or unexpected finding. IMPRESSION: 1. Hyperenhancement and mild enlargement of the right parotid gland, consistent with parotitis. No evidence of ductal dilatation or abscess. 2. Prominent right level 1B, 2A, and 2B lymph nodes, favored to be reactive. Electronically Signed   By: Wiliam KeAlison  Vasan M.D.   On: 05/19/2021 19:02    Procedures Procedures    Medications Ordered in ED Medications  ketorolac (TORADOL) 15 MG/ML injection 15 mg (15 mg Intramuscular Given 05/19/21 1648)  iohexol (OMNIPAQUE) 300 MG/ML solution 100 mL (75 mLs Intravenous Contrast Given 05/19/21 1820)    ED Course/ Medical Decision Making/ A&P Clinical Course as of 05/19/21 1922  Thu May 19, 2021  1706 This is a 36 year old female with a history of lupus, DVT on Xarelto, presenting to the ED with concern for parotitis.  She reports she had swelling of the right face along the jawline, was diagnosed with parotitis and started on Augmentin 5 days ago.  He has been taking his medication, as well as Tylenol for pain, feels that overall the swelling is gone down but she still has persistent pain.  She reports that she has  shaking chills at home.  Denies fevers.  Denies that she is ever had this problem before.  She is also been using warm compresses on her face, taking lemon drops to express secretions.  She denies any recent dental procedures, wears braces.  She denies any history of cavities.  On exam she is afebrile, generally well-appearing.  Denies any dental cavities or peridental abscesses.  She does have tenderness and some mild swelling of the right parotid gland, I cannot express any pus or drainage into the mouth.  She also has some reactive lymphadenopathy of the neck bilaterally.  Her airway is otherwise intact.  I do not see evidence for Ludwig angina.  She has a minor leukocytosis.  I do think is reasonable to  obtain a CT scan at this time to evaluate for continued parotitis versus obstructing lesion or stone.  I also talked about starting on a Medrol Dosepak at home for her inflammation, as we generally want to avoid long-term NSAIDs given that she is on Xarelto.  She is in agreement with this plan including imaging. [MT]    Clinical Course User Index [MT] Trifan, Kermit Balo, MD                           Medical Decision Making Amount and/or Complexity of Data Reviewed Labs: ordered. Radiology: ordered.  Risk Prescription drug management.   Mechelle L Dubose is a 36 y.o. female with significant comorbidities impacting her care today to include lupus who presents to the emergency department with parotitis.  I am concerned this is progressing parotitis as its not getting better after 5 days of goal-directed medical therapy.  This could be a clogged stone.  I would like to get imaging to evaluate.  She is currently in no acute distress and talking in complete sentences.  We will get basic labs and imaging of the parotid glands.  I personally reviewed the imaging and labs.  CBC did show mild leukocytosis.  BMP was normal.  CT maxillofacial did reveal inflammation of the right parotid gland consistent with  parotitis.  There was some surrounding reactive lymph nodes.  I do agree with the radiologist interpretation.  Seeing as she is clinically improving with the Augmentin I will have her continue that.  I will add a Medrol Dosepak and have her start in the morning so she does not have insomnia.  I discussed all the results at the bedside with the patient.  She was amenable to this plan.  All questions and concerns addressed.  Given the clinical scenario, I do believe that she is safe in the outpatient setting.  I will have her follow-up with her primary care doctor.  I will have her return for worsening symptoms.  Strict return precautions given.  Final Clinical Impression(s) / ED Diagnoses Final diagnoses:  Parotitis    Rx / DC Orders ED Discharge Orders          Ordered    predniSONE (STERAPRED UNI-PAK 21 TAB) 10 MG (21) TBPK tablet  Daily        05/19/21 1921              Teressa Lower, Cordelia Poche 05/19/21 1922    Terald Sleeper, MD 05/20/21 1109

## 2021-05-19 NOTE — ED Notes (Signed)
Patient transported to CT 

## 2021-05-19 NOTE — ED Triage Notes (Signed)
Pt presents with parotiditis that was dx at [facility] on 1/14. Pt states the facial swelling started on 1/9. Pt was given a Rx for Augmentin, but has had minimal improvement.

## 2022-12-04 ENCOUNTER — Ambulatory Visit (HOSPITAL_BASED_OUTPATIENT_CLINIC_OR_DEPARTMENT_OTHER): Payer: 59 | Admitting: Family Medicine

## 2022-12-04 ENCOUNTER — Encounter (HOSPITAL_BASED_OUTPATIENT_CLINIC_OR_DEPARTMENT_OTHER): Payer: Self-pay | Admitting: Family Medicine

## 2022-12-04 VITALS — BP 125/91 | HR 77 | Ht 66.0 in | Wt 160.0 lb

## 2022-12-04 DIAGNOSIS — D6862 Lupus anticoagulant syndrome: Secondary | ICD-10-CM

## 2022-12-04 DIAGNOSIS — M79662 Pain in left lower leg: Secondary | ICD-10-CM

## 2022-12-04 DIAGNOSIS — D509 Iron deficiency anemia, unspecified: Secondary | ICD-10-CM

## 2022-12-04 DIAGNOSIS — M79661 Pain in right lower leg: Secondary | ICD-10-CM | POA: Diagnosis not present

## 2022-12-04 DIAGNOSIS — I825Z3 Chronic embolism and thrombosis of unspecified deep veins of distal lower extremity, bilateral: Secondary | ICD-10-CM

## 2022-12-04 DIAGNOSIS — F32A Depression, unspecified: Secondary | ICD-10-CM

## 2022-12-04 DIAGNOSIS — Z7689 Persons encountering health services in other specified circumstances: Secondary | ICD-10-CM

## 2022-12-04 DIAGNOSIS — F419 Anxiety disorder, unspecified: Secondary | ICD-10-CM

## 2022-12-04 MED ORDER — ESCITALOPRAM OXALATE 10 MG PO TABS: 10.0000 mg | ORAL_TABLET | Freq: Every day | ORAL | 2 refills | Status: DC

## 2022-12-04 NOTE — Progress Notes (Signed)
New Patient Office Visit  Subjective    Patient ID: Tiffany [name], female    DOB: June 22, 1985  Age: 37 y.o. MRN: [id number]  CC:  Chief Complaint  Patient presents with   Edema    Leg swelling, needs to get back on blood thinner. Has been without for    HPI Tiffany [name] is a 37 year-old female patient presents to establish care. She has concerns about her history of DVTs and not being on anticoagulation since 2019. She reports lower leg swelling.  Former PCP: Tiffany Passe, NP    History of DVT- first one in 2007 in L leg, second DVT in 2013 Has not been taking Xarelto 2019, due to cost and does not want to be on for life  Does note bilateral lower extremity edema   Past surgical history- Tubal ligation 11/15/2011- she is interested in wanting to get it reversed   Anxiety & depression- Lexapro 10mg  initially   Occupation- social services   Outpatient Encounter Medications as of 12/04/2022  Medication Sig   [DISCONTINUED] escitalopram (LEXAPRO) 10 MG tablet Take 1 tablet (10 mg total) by mouth daily.   [DISCONTINUED] amoxicillin-clavulanate (AUGMENTIN) 875-125 MG tablet Take 1 tablet by mouth every 12 (twelve) hours. (Patient not taking: Reported on 12/04/2022)   [DISCONTINUED] predniSONE (STERAPRED UNI-PAK 21 TAB) 10 MG (21) TBPK tablet Take by mouth daily. Take 6 tabs by mouth daily  for 2 days, then 5 tabs for 2 days, then 4 tabs for 2 days, then 3 tabs for 2 days, 2 tabs for 2 days, then 1 tab by mouth daily for 2 days (Patient not taking: Reported on 12/04/2022)   [DISCONTINUED] rivaroxaban (XARELTO) 20 MG TABS tablet Take 1 tablet (20 mg total) by mouth daily after lunch. (Patient not taking: Reported on 12/04/2022)   No facility-administered encounter medications on file as of 12/04/2022.   Past Medical History:  Diagnosis Date   Anemia 2007   HGB 11.04 Feb 2009.   Asthma    Deep vein thrombosis (DVT) (HCC)    left leg   Hernia, umbilical    plans for repair soon    Lupus anticoagulant disorder (HCC) 11/07   Detected   Missed abortion 1/10   SP D&C   Preterm labor    SAB (spontaneous abortion) 1/09    Past Surgical History:  Procedure Laterality Date   CESAREAN SECTION  2004,2007   CESAREAN SECTION  11/20/2010   Procedure: CESAREAN SECTION;  Surgeon: Brock Bad, MD;  Location: [facility];  Service: Gynecology;  Laterality: N/A;  repeat cesarean section    CESAREAN SECTION  11/15/2011   Procedure: CESAREAN SECTION;  Surgeon: Kathreen Cosier, MD;  Location: [facility];  Service: Gynecology;  Laterality: N/A;   DILATION AND CURETTAGE OF UTERUS  1/10   For missed AB   HYSTEROSCOPY WITH D & C N/A 10/15/2013   Procedure: HYSTEROSCOPY/DILATATION AND CURETTAGE;  Surgeon: Kathreen Cosier, MD;  Location: [facility];  Service: Gynecology;  Laterality: N/A;   INSERTION OF MESH N/A 03/01/2020   Procedure: INSERTION OF MESH;  Surgeon: Abigail Miyamoto, MD;  Location: [facility];  Service: General;  Laterality: N/A;   TONSILLECTOMY     UMBILICAL HERNIA REPAIR N/A 03/01/2020   Procedure: HERNIA REPAIR UMBILICAL WITH MESH;  Surgeon: Abigail Miyamoto, MD;  Location: [facility];  Service: General;  Laterality: N/A;    Family History  Problem Relation Age of Onset   Hyperlipidemia  Mother    Hypertension Mother    Arthritis Mother    Hypertension Father    Hyperlipidemia Father    Diabetes Father    Stroke Father    Cataracts Father    Other Neg Hx      Review of Systems  Constitutional:  Negative for malaise/fatigue.  Eyes:  Negative for blurred vision and double vision.  Respiratory:  Negative for cough and shortness of breath.   Cardiovascular:  Positive for leg swelling. Negative for chest pain and palpitations.  Gastrointestinal:  Negative for abdominal pain, nausea and vomiting.  Musculoskeletal:  Negative for myalgias.  Neurological:  Negative for dizziness, weakness and headaches.  Psychiatric/Behavioral:   Positive for depression. Negative for suicidal ideas. The patient is nervous/anxious.         Objective    BP (!) 121/94   Pulse 77   Ht 5\' 6"  (1.676 m)   Wt 160 lb (72.6 kg)   SpO2 100%   BMI 25.82 kg/m   Physical Exam Constitutional:      Appearance: Normal appearance.  Cardiovascular:     Rate and Rhythm: Normal rate and regular rhythm.     Pulses: Normal pulses.     Heart sounds: Normal heart sounds.  Pulmonary:     Effort: Pulmonary effort is normal.     Breath sounds: Normal breath sounds.  Musculoskeletal:     Right lower leg: No edema.     Left lower leg: No edema.  Neurological:     Mental Status: She is alert.  Psychiatric:        Mood and Affect: Mood normal.        Behavior: Behavior normal.     Assessment & Plan:   1. Encounter to establish care Patient presents today to establish care with new primary care provider. Her former PCP was Tiffany Passe, NP and last office visit in 03/2020 for depression. Review of past medical history includes chronic DVTs, chronic anticoagulation, lupus anticoagulant disorder, anemia, anxiety/depression. Past surgical history includes tubal ligation, D&C, and umbilical hernia repair. Patient reports she is no longer taking anticoagulation due to cost and desire to not take lifelong medication. She reports today that she has concerns about occasional swelling and pain in her lower extremities. Will complete physical exam paperwork and fax to employer after lab results return.  - Comprehensive metabolic panel - Hemoglobin A1c - Lipid panel - TSH Rfx on Abnormal to Free T4  2. Pain in both lower legs Patient reports she notices slight swelling and pain in her lower extremities. Denies redness, warmth, one lower leg larger than the other, shortness of breath, palpitations, chest pain. No lower extremity edema present in lower extremities. Slight tenderness to palpation near left lower ankle, slight ecchymosis present. Due to  history of chronic DVTs and not being on anticoagulation recently, plan to obtain bilateral LE [place]. Review of notes from 2016, appears that patient could be taken off Xarelto depending on her repeat doppler US- plan to obtain US today and refer patient to hematology for further management of anticoagulation.  - US Venous Img Lower Bilateral (DVT)  3. 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 Her second episode was in 2013, associated with pregnancy as well, although it was unclear if she had a chronic blood clot. Most recent  dopplers in 12/2012 showed chronic DVTs and was started on Xarelto at that time. Most recent visit with hematology 08/17/2016 and was taking daily anticoagulation at that time.   4. Lupus anticoagulant disorder (HCC) See #3 - Ambulatory referral to Hematology / Oncology  5. Iron deficiency anemia, unspecified iron deficiency anemia type Patient has a history of anemia, will assess her CBC and iron panel today.  - CBC with Differential/Platelet - Iron, TIBC and Ferritin Panel  6. Anxiety and depression Patient reports she has a history of anxiety and depression and has been struggling with her mood recently. She reports occupational stress and would like to restart on Lexapro. Denies SI/HI. GAD7 and PHQ9 completed today. Counseled patient to start with 10mg  daily for the next 2-4 weeks, and then she can increase to 20mg . Refills provided today.  - escitalopram (LEXAPRO) 10 MG tablet; Take 1 tablet (10 mg total) by mouth daily.  Dispense: 60 tablet; Refill: 2   Return in about 6 weeks (around 01/15/2023) for Mood f/u.   Alyson Reedy, FNP

## 2022-12-05 ENCOUNTER — Other Ambulatory Visit (HOSPITAL_BASED_OUTPATIENT_CLINIC_OR_DEPARTMENT_OTHER): Payer: Self-pay | Admitting: Family Medicine

## 2022-12-05 ENCOUNTER — Encounter (HOSPITAL_BASED_OUTPATIENT_CLINIC_OR_DEPARTMENT_OTHER): Payer: Self-pay | Admitting: Family Medicine

## 2022-12-05 ENCOUNTER — Ambulatory Visit
Admission: RE | Admit: 2022-12-05 | Discharge: 2022-12-05 | Disposition: A | Discharge status: Home / Self Care | Payer: 59 | Source: Ambulatory Visit | Attending: Family Medicine | Admitting: Family Medicine

## 2022-12-05 DIAGNOSIS — I825Z3 Chronic embolism and thrombosis of unspecified deep veins of distal lower extremity, bilateral: Secondary | ICD-10-CM

## 2022-12-06 ENCOUNTER — Other Ambulatory Visit (HOSPITAL_BASED_OUTPATIENT_CLINIC_OR_DEPARTMENT_OTHER): Payer: Self-pay | Admitting: Family Medicine

## 2022-12-06 ENCOUNTER — Other Ambulatory Visit (HOSPITAL_BASED_OUTPATIENT_CLINIC_OR_DEPARTMENT_OTHER): Payer: Self-pay

## 2022-12-06 DIAGNOSIS — I825Z3 Chronic embolism and thrombosis of unspecified deep veins of distal lower extremity, bilateral: Secondary | ICD-10-CM

## 2022-12-06 DIAGNOSIS — D6862 Lupus anticoagulant syndrome: Secondary | ICD-10-CM

## 2022-12-11 ENCOUNTER — Other Ambulatory Visit (HOSPITAL_BASED_OUTPATIENT_CLINIC_OR_DEPARTMENT_OTHER): Payer: Self-pay | Admitting: Family Medicine

## 2022-12-11 DIAGNOSIS — D72820 Lymphocytosis (symptomatic): Secondary | ICD-10-CM

## 2022-12-12 ENCOUNTER — Other Ambulatory Visit (HOSPITAL_BASED_OUTPATIENT_CLINIC_OR_DEPARTMENT_OTHER): Payer: Self-pay | Admitting: Family Medicine

## 2022-12-12 NOTE — Progress Notes (Signed)
Received message from clinical pharmacist at [facility]. "After reviewing her chart and imaging and discussing the patient with Dr. Myra Gianotti, we do not recommend restarting anticoagulation for her chronic DVT. 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 Her hypercoagulable workup previously by hematology was negative." The DVT clinic recommends to re-establish with hematology by the end of this month and follow treatment plan recommended hematology.

## 2022-12-22 ENCOUNTER — Other Ambulatory Visit: Payer: Self-pay | Admitting: *Deleted

## 2022-12-22 DIAGNOSIS — D6862 Lupus anticoagulant syndrome: Secondary | ICD-10-CM

## 2022-12-25 ENCOUNTER — Inpatient Hospital Stay: Payer: 59 | Attending: Oncology | Admitting: Oncology

## 2022-12-25 ENCOUNTER — Inpatient Hospital Stay: Payer: 59

## 2022-12-25 VITALS — BP 120/85 | HR 98 | Temp 98.1°F | Resp 18 | Ht 66.0 in | Wt 162.2 lb

## 2022-12-25 DIAGNOSIS — Z800 Family history of malignant neoplasm of digestive organs: Secondary | ICD-10-CM | POA: Diagnosis not present

## 2022-12-25 DIAGNOSIS — D6862 Lupus anticoagulant syndrome: Secondary | ICD-10-CM

## 2022-12-25 DIAGNOSIS — I82409 Acute embolism and thrombosis of unspecified deep veins of unspecified lower extremity: Secondary | ICD-10-CM

## 2022-12-25 DIAGNOSIS — I82432 Acute embolism and thrombosis of left popliteal vein: Secondary | ICD-10-CM | POA: Insufficient documentation

## 2022-12-25 DIAGNOSIS — I82412 Acute embolism and thrombosis of left femoral vein: Secondary | ICD-10-CM | POA: Insufficient documentation

## 2022-12-25 LAB — CMP (CANCER CENTER ONLY)
ALT: 13 U/L (ref 0–44)
AST: 20 U/L (ref 15–41)
Albumin: 4.4 g/dL (ref 3.5–5.0)
Alkaline Phosphatase: 34 U/L — ABNORMAL LOW (ref 38–126)
Anion gap: 7 (ref 5–15)
BUN: 9 mg/dL (ref 6–20)
CO2: 26 mmol/L (ref 22–32)
Calcium: 9 mg/dL (ref 8.9–10.3)
Chloride: 105 mmol/L (ref 98–111)
Creatinine: 0.9 mg/dL (ref 0.44–1.00)
GFR, Estimated: >60 mL/min (ref >60)
Glucose, Bld: 99 mg/dL (ref 70–99)
Potassium: 3.9 mmol/L (ref 3.5–5.1)
Sodium: 138 mmol/L (ref 135–145)
Total Bilirubin: 0.5 mg/dL (ref 0.3–1.2)
Total Protein: 7.1 g/dL (ref 6.5–8.1)

## 2022-12-25 LAB — CBC WITH DIFFERENTIAL (CANCER CENTER ONLY)
Abs Immature Granulocytes: 0.02 10*3/uL (ref 0.00–0.07)
Basophils Absolute: 0 10*3/uL (ref 0.0–0.1)
Basophils Relative: 0 %
Eosinophils Absolute: 0.1 10*3/uL (ref 0.0–0.5)
Eosinophils Relative: 2 %
HCT: 36.7 % (ref 36.0–46.0)
Hemoglobin: 12.3 g/dL (ref 12.0–15.0)
Immature Granulocytes: 0 %
Lymphocytes Relative: 34 %
Lymphs Abs: 2.7 10*3/uL (ref 0.7–4.0)
MCH: 29.8 pg (ref 26.0–34.0)
MCHC: 33.5 g/dL (ref 30.0–36.0)
MCV: 88.9 fL (ref 80.0–100.0)
Monocytes Absolute: 0.7 10*3/uL (ref 0.1–1.0)
Monocytes Relative: 9 %
Neutro Abs: 4.3 10*3/uL (ref 1.7–7.7)
Neutrophils Relative %: 55 %
Platelet Count: 347 10*3/uL (ref 150–400)
RBC: 4.13 MIL/uL (ref 3.87–5.11)
RDW: 12.9 % (ref 11.5–15.5)
WBC Count: 7.8 10*3/uL (ref 4.0–10.5)
nRBC: 0 % (ref 0.0–0.2)

## 2022-12-25 LAB — SAVE SMEAR(SSMR), FOR PROVIDER SLIDE REVIEW

## 2022-12-25 NOTE — Progress Notes (Signed)
[facility] New Patient Consult   Requesting MD: Tiffany Reedy, Fnp [address] [place],  [place] [address]   Tiffany [name] 36 y.o.  1985/08/15    Reason for Consult: History of recurrent left lower extremity DVT   HPI: [name] was diagnosed with a left lower extremity DVT immediately postpartum in 2007.  She reports being treated with Coumadin for several years and discontinued anticoagulation approximately 2010.  She had several miscarriages.  She had 2 successful pregnancies in 2012 and 2013 with C-section deliveries.  She received Lovenox during these pregnancies.  She developed recurrent left leg swelling following the delivery in 2013.  She was started on Xarelto anticoagulation after the pregnancy in 2013.  It is unclear whether she had a documented acute DVT.  A Doppler 01/03/2013 showed chronic deep vein thrombosis in the common femoral, peroneal, and popliteal veins. She saw Dr. Clelia [name] on 02/26/2013.  There is a note in the chart of a "lupus anticoagulant "disorder in November 2007, but Dr. Clelia [name] could not find documentation of this.  He performed a hypercoagulation evaluation including a lupus anticoagulant panel.  The studies were negative.  She remained on Xarelto anticoagulation with no history of recurrent DVT.  A Doppler in February 2016 revealed persistent chronic deep vein thrombosis.  She elected to continue Xarelto after discussing the risk/benefit of anticoagulation with Dr. Clelia [name]. She reports discontinuing Xarelto in 2019 due to a lack of insurance coverage.  She recently established with a new primary provider.  A bilateral lower extremity Doppler on 12/05/2022 revealed nonocclusive thrombus in the left common femoral, femoral, popliteal, and possibly posterior tibial veins consistent with chronic recanalized DVT.  She reports intermittent swelling and discoloration of the left leg.  The swelling is worse with prolonged standing or  sitting.  She has not developed consistent swelling, erythema, or pain as she did with the diagnosis of acute DVT in 2007.  She has no other history of venous or arterial thrombosis.  She has a history of several miscarriages.  No family history of DVT or phlebitis.  Past Medical History:  Diagnosis Date   Anemia 2007   HGB 11.04 Feb 2009.   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   Detected   Missed abortion 1/10   SP D&C   Preterm labor    SAB (spontaneous abortion) 1/09    Past Surgical History:  Procedure Laterality Date   CESAREAN SECTION  2004,2007   CESAREAN SECTION  11/20/2010   Procedure: CESAREAN SECTION;  Surgeon: Tiffany Bad, MD;  Location: [facility];  Service: Gynecology;  Laterality: N/A;  repeat cesarean section    CESAREAN SECTION  11/15/2011   Procedure: CESAREAN SECTION;  Surgeon: Tiffany Cosier, MD;  Location: [facility];  Service: Gynecology;  Laterality: N/A;   DILATION AND CURETTAGE OF UTERUS  1/10   For missed AB   HYSTEROSCOPY WITH D & C N/A 10/15/2013   Procedure: HYSTEROSCOPY/DILATATION AND CURETTAGE;  Surgeon: Tiffany Cosier, MD;  Location: [facility];  Service: Gynecology;  Laterality: N/A;   INSERTION OF MESH N/A 03/01/2020   Procedure: INSERTION OF MESH;  Surgeon: Tiffany Miyamoto, MD;  Location: [facility];  Service: General;  Laterality: N/A;   TONSILLECTOMY     UMBILICAL HERNIA REPAIR N/A 03/01/2020   Procedure: HERNIA  REPAIR UMBILICAL WITH MESH;  Surgeon: Tiffany Miyamoto, MD;  Location: [facility];  Service: General;  Laterality: N/A;    Medications: Reviewed  Allergies: No Known Allergies  Family history: No family history of venous thrombosis, several great uncles had "cancer ", 1 had colon cancer.  Social History:   She lives with her husband and 4 sons in [place].  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  She has not used cigarettes.  Occasional wine use.  No risk factor for HIV or hepatitis.  She received Red cell transfusions for anemia in the past (approximately ages 51 and 44)  ROS:   Positives include: Sweats every other night, exertional dyspnea for the past few months, nausea with certain smells, right submandibular "lump "recently-resolved, intermittent left leg swelling  A complete ROS was otherwise negative.  Physical Exam:  Blood pressure 120/85, pulse 98, temperature 98.1 F (36.7 C), temperature source Oral, resp. rate 18, height 5\' 6"  (1.676 m), weight 162 lb 3.2 oz (73.6 kg), SpO2 100%.  HEENT: Oropharynx without visible mass, neck without mass Lungs: Clear bilaterally, distant breath sounds, no respiratory distress Cardiac: Regular rate and rhythm Abdomen: No hepatosplenomegaly, no mass, mild diffuse tenderness Vascular: Mild edema throughout the left leg with varicosities/venous engorgement.  No erythema or palpable cord. Lymph nodes: No cervical, supraclavicular, axillary, or inguinal nodes Neurologic: Alert and oriented, the motor exam appears intact in the upper and lower extremities bilaterally Skin: No rash, areas of hypopigmentation Musculoskeletal: Spine tenderness   LAB:  CBC  Lab Results  Component Value Date   WBC 7.8 12/25/2022   HGB 12.3 12/25/2022   HCT 36.7 12/25/2022   MCV 88.9 12/25/2022   PLT 347 12/25/2022   NEUTROABS 4.3 12/25/2022        CMP  Lab Results  Component Value Date   NA 138 12/25/2022   K 3.9 12/25/2022   CL 105 12/25/2022   CO2 26 12/25/2022   GLUCOSE 99 12/25/2022   BUN 9 12/25/2022   CREATININE 0.90 12/25/2022   CALCIUM 9.0 12/25/2022   PROT 7.1 12/25/2022   ALBUMIN 4.4 12/25/2022   AST 20 12/25/2022   ALT 13 12/25/2022   ALKPHOS 34 (L) 12/25/2022   BILITOT 0.5 12/25/2022   GFRNONAA >60 12/25/2022   GFRAA 98 03/15/2020        Assessment/Plan:   Left lower extremity DVT  DVT in 2007  following pregnancy, treated with approximately 3 years of Coumadin 2 subsequent pregnancies with Lovenox prophylaxis Multiple miscarriages Left lower extremity swelling following pregnancy in 2013 treated with Lovenox and Xarelto, unclear whether she was documented to have an acute DVT at the time Xarelto anticoagulation continued until 2019 Report of a "lupus anticoagulant disorder "in 2007-no documentation available in the medical record Negative hypercoagulation panel including lupus anticoagulant panel 02/26/2013 Multiple lower extremity Doppler evaluations, most recently 12/05/2022 with chronic left lower extremity DVT Umbilical hernia repair November 2021   Disposition:   Ms. Sirkin has a remote history of left lower extremity DVT.  She had a DVT following pregnancy in 2007 and may have had a subsequent DVT following a pregnancy in 2013.  The venous thrombosis appears to have been provoked by pregnancy.  She has been maintained off of anticoagulation for the past 5 years without evidence of recurrent acute DVT.  Multiple left lower extremity Dopplers have revealed evidence of chronic DVT.  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  A lupus anticoagulant panel was negative in 2014.  I have a low clinical suspicion for antiphospholipid syndrome.  We repeated a lupus anticoagulant panel today.  If the lupus anticoagulant panel returns negative I do not recommend anticoagulation.  She is at increased risk for venous thrombosis in the setting of a future pregnancy and potentially with immobility/surgery.  She will alert treating physicians or they can scribe DVT prophylaxis as indicated.  Review of the electronic medical record reveals a history of intermittent mild lymphocytosis.  This is likely benign finding.  I recommend a CBC at the time of her yearly physical.  I will be glad to see her back if she develops a  persistent/progressive lymphocytosis or a new hematologic abnormality.  Ms. Duffield will seek medical attention for symptoms of a a new DVT.  She plans to continue clinical follow-up with Shaune Pascal.  I am available to see her in the future as needed.  Thornton Papas, MD  12/25/2022, 11:17 AM

## 2022-12-26 LAB — LUPUS ANTICOAGULANT PANEL
DRVVT: 50.1 s — ABNORMAL HIGH (ref 0.0–47.0)
PTT Lupus Anticoagulant: 40.4 s (ref 0.0–43.5)

## 2022-12-26 LAB — DRVVT CONFIRM: dRVVT Confirm: 1.1 ratio (ref 0.8–1.2)

## 2022-12-26 LAB — DRVVT MIX: dRVVT Mix: 41.2 s — ABNORMAL HIGH (ref 0.0–40.4)

## 2022-12-28 ENCOUNTER — Telehealth: Payer: Self-pay | Admitting: *Deleted

## 2022-12-28 ENCOUNTER — Other Ambulatory Visit: Payer: Self-pay

## 2022-12-28 ENCOUNTER — Encounter: Payer: Self-pay | Admitting: *Deleted

## 2022-12-28 ENCOUNTER — Other Ambulatory Visit: Payer: Self-pay | Admitting: *Deleted

## 2022-12-28 DIAGNOSIS — D6862 Lupus anticoagulant syndrome: Secondary | ICD-10-CM

## 2022-12-28 NOTE — Telephone Encounter (Signed)
no

## 2022-12-30 ENCOUNTER — Other Ambulatory Visit (HOSPITAL_BASED_OUTPATIENT_CLINIC_OR_DEPARTMENT_OTHER): Payer: Self-pay | Admitting: Family Medicine

## 2022-12-30 DIAGNOSIS — F419 Anxiety disorder, unspecified: Secondary | ICD-10-CM

## 2022-12-31 LAB — CARDIOLIPIN ANTIBODIES, IGG, IGM, IGA
Anticardiolipin IgA: <9 U/mL (ref 0–11)
Anticardiolipin IgG: 11 GPL U/mL (ref 0–14)
Anticardiolipin IgM: <9 [MPL'U]/mL (ref 0–12)

## 2022-12-31 LAB — BETA-2-GLYCOPROTEIN I ABS, IGG/M/A
Beta-2 Glyco I IgG: <9 GPI IgG units (ref 0–20)
Beta-2-Glycoprotein I IgA: <9 GPI IgA units (ref 0–25)
Beta-2-Glycoprotein I IgM: <9 GPI IgM units (ref 0–32)

## 2023-01-15 ENCOUNTER — Encounter (HOSPITAL_BASED_OUTPATIENT_CLINIC_OR_DEPARTMENT_OTHER): Payer: Self-pay | Admitting: Family Medicine

## 2023-01-15 ENCOUNTER — Ambulatory Visit (HOSPITAL_BASED_OUTPATIENT_CLINIC_OR_DEPARTMENT_OTHER): Payer: 59 | Admitting: Family Medicine

## 2023-01-15 VITALS — BP 120/87 | HR 84 | Ht 66.0 in | Wt 163.7 lb

## 2023-01-15 DIAGNOSIS — D6862 Lupus anticoagulant syndrome: Secondary | ICD-10-CM | POA: Diagnosis not present

## 2023-01-15 DIAGNOSIS — I87009 Postthrombotic syndrome without complications of unspecified extremity: Secondary | ICD-10-CM | POA: Insufficient documentation

## 2023-01-15 DIAGNOSIS — Z0182 Encounter for allergy testing: Secondary | ICD-10-CM | POA: Insufficient documentation

## 2023-01-15 DIAGNOSIS — F330 Major depressive disorder, recurrent, mild: Secondary | ICD-10-CM | POA: Diagnosis not present

## 2023-01-15 MED ORDER — ESCITALOPRAM OXALATE 20 MG PO TABS: 20.0000 mg | ORAL_TABLET | Freq: Every day | ORAL | 2 refills | Status: DC

## 2023-01-15 NOTE — Assessment & Plan Note (Signed)
Review of Dr. Kalman Drape note from 12/25/2022. Based on patient's labs, does not need to be on chronic anticoagulation.

## 2023-01-15 NOTE — Assessment & Plan Note (Signed)
Patient would like referral for allergy testing.

## 2023-01-15 NOTE — Assessment & Plan Note (Signed)
Patient is a pleasant 37 year-old female patient who presents today for mood follow-up. GAD7 completed with score of 10, and PHQ9 completed with a score of 10. Patient reports her anxiety is more controlled but her depression has increased. Denies SI/HI. Current medication regimen is Lexapro mg daily. Discussed options with patient and she is agreeable to increasing dose to 20mg  daily. May take 4-6 weeks to notice an improvement in her mood. Plan to follow-up in 6 weeks.

## 2023-01-15 NOTE — Progress Notes (Signed)
Established Patient Office Visit  Subjective   Patient ID: Tiffany [name], female    DOB: 01-07-1986  Age: 37 y.o. MRN: [id number]  ANXIETY: Tiffany [name] is a 37 year-old female patient who presents for the medical management of anxiety.  Current medication regimen: Lexapro 10mg  daily  Counseling: not currently, saw in the past  Well controlled: somewhat, reports she is still experiencing irritability at work. When she was younger, she was in anger management classes.  Denies SI/HI.      01/15/2023    1:48 PM 12/04/2022    1:37 PM 03/15/2020    3:39 PM 11/14/2019   10:18 AM  GAD 7 : Generalized Anxiety Score  Nervous, Anxious, on Edge 0 3 2 0  Control/stop worrying 1 3 3  0  Worry too much - different things 2 3 3 1   Trouble relaxing 2 3 3  0  Restless 3 3 3 2   Easily annoyed or irritable 2 3 3  0  Afraid - awful might happen 0 2 2 0  Total GAD 7 Score 10 20 19 3   Anxiety Difficulty Not difficult at all Extremely difficult  Not difficult at all      01/15/2023    1:48 PM 12/25/2022   11:02 AM 12/04/2022    1:37 PM  PHQ9 SCORE ONLY  PHQ-9 Total Score 10 4 16     Review of Systems  Constitutional:  Negative for malaise/fatigue.  Respiratory:  Negative for cough and shortness of breath.   Cardiovascular:  Negative for chest pain, palpitations and leg swelling.  Gastrointestinal:  Negative for abdominal pain, nausea and vomiting.  Musculoskeletal:  Negative for myalgias.  Neurological:  Negative for dizziness, weakness and headaches.  Psychiatric/Behavioral:  Positive for depression. Negative for substance abuse and suicidal ideas. The patient is nervous/anxious. The patient does not have insomnia.       Objective:     BP 120/87   Pulse 84   Ht 5\' 6"  (1.676 m)   Wt 163 lb 11.2 oz (74.3 kg)   SpO2 100%   BMI 26.42 kg/m  BP Readings from Last 3 Encounters:  01/15/23 120/87  12/25/22 120/85  12/04/22 (!) 125/91     Physical Exam Constitutional:       Appearance: Normal appearance.  Cardiovascular:     Rate and Rhythm: Normal rate and regular rhythm.     Pulses: Normal pulses.     Heart sounds: Normal heart sounds.  Pulmonary:     Effort: Pulmonary effort is normal.     Breath sounds: Normal breath sounds.  Neurological:     Mental Status: She is alert.  Psychiatric:        Mood and Affect: Mood normal.        Behavior: Behavior normal.        Thought Content: Thought content normal.        Judgment: Judgment normal.      Assessment & Plan:  Mild episode of recurrent major depressive disorder [facility]) Assessment & Plan: Patient is a pleasant 37 year-old female patient who presents today for mood follow-up. GAD7 completed with score of 10, and PHQ9 completed with a score of 10. Patient reports her anxiety is more controlled but her depression has increased. Denies SI/HI. Current medication regimen is Lexapro mg daily. Discussed options with patient and she is agreeable to increasing dose to 20mg  daily. May take 4-6 weeks to notice an improvement in her mood. Plan to follow-up in 6 weeks.  Orders: -     Escitalopram Oxalate; Take 1 tablet (20 mg total) by mouth daily.  Dispense: 30 tablet; Refill: 2  Lupus anticoagulant disorder [facility]) Assessment & Plan: Review of Dr. Kalman Drape note from 12/25/2022. Based on patient's labs, does not need to be on chronic anticoagulation.    Encounter for allergy testing Assessment & Plan: Patient would like referral for allergy testing.   Orders: -     Ambulatory referral to Allergy     Return in about 6 weeks (around 02/26/2023) for Mood f/u (can be virtual) .    Alyson Reedy, FNP

## 2023-01-23 ENCOUNTER — Other Ambulatory Visit (HOSPITAL_BASED_OUTPATIENT_CLINIC_OR_DEPARTMENT_OTHER): Payer: Self-pay

## 2023-01-23 ENCOUNTER — Encounter (HOSPITAL_BASED_OUTPATIENT_CLINIC_OR_DEPARTMENT_OTHER): Payer: Self-pay | Admitting: Family Medicine

## 2023-01-23 DIAGNOSIS — Z7689 Persons encountering health services in other specified circumstances: Secondary | ICD-10-CM

## 2023-02-12 ENCOUNTER — Encounter (HOSPITAL_BASED_OUTPATIENT_CLINIC_OR_DEPARTMENT_OTHER): Payer: Self-pay | Admitting: Family Medicine

## 2023-02-13 ENCOUNTER — Other Ambulatory Visit (HOSPITAL_BASED_OUTPATIENT_CLINIC_OR_DEPARTMENT_OTHER): Payer: Self-pay | Admitting: Family Medicine

## 2023-02-13 DIAGNOSIS — F330 Major depressive disorder, recurrent, mild: Secondary | ICD-10-CM

## 2023-02-26 ENCOUNTER — Encounter (HOSPITAL_BASED_OUTPATIENT_CLINIC_OR_DEPARTMENT_OTHER): Payer: Self-pay | Admitting: Family Medicine

## 2023-02-26 ENCOUNTER — Telehealth (HOSPITAL_BASED_OUTPATIENT_CLINIC_OR_DEPARTMENT_OTHER): Payer: 59 | Admitting: Family Medicine

## 2023-03-07 ENCOUNTER — Ambulatory Visit: Payer: 59 | Admitting: Allergy

## 2023-03-07 ENCOUNTER — Telehealth (HOSPITAL_BASED_OUTPATIENT_CLINIC_OR_DEPARTMENT_OTHER): Payer: 59 | Admitting: Family Medicine

## 2023-03-22 ENCOUNTER — Encounter (HOSPITAL_BASED_OUTPATIENT_CLINIC_OR_DEPARTMENT_OTHER): Payer: Self-pay | Admitting: Family Medicine

## 2023-08-15 ENCOUNTER — Ambulatory Visit (HOSPITAL_BASED_OUTPATIENT_CLINIC_OR_DEPARTMENT_OTHER): Admitting: Family Medicine

## 2023-08-15 ENCOUNTER — Encounter (HOSPITAL_BASED_OUTPATIENT_CLINIC_OR_DEPARTMENT_OTHER): Payer: Self-pay | Admitting: Family Medicine

## 2023-08-15 VITALS — BP 138/85 | HR 87 | Ht 66.0 in | Wt 165.0 lb

## 2023-08-15 DIAGNOSIS — I82599 Chronic embolism and thrombosis of other specified deep vein of unspecified lower extremity: Secondary | ICD-10-CM

## 2023-08-15 DIAGNOSIS — M79605 Pain in left leg: Secondary | ICD-10-CM

## 2023-08-15 DIAGNOSIS — R41840 Attention and concentration deficit: Secondary | ICD-10-CM

## 2023-08-15 DIAGNOSIS — I82509 Chronic embolism and thrombosis of unspecified deep veins of unspecified lower extremity: Secondary | ICD-10-CM | POA: Insufficient documentation

## 2023-08-15 DIAGNOSIS — M79604 Pain in right leg: Secondary | ICD-10-CM

## 2023-08-15 DIAGNOSIS — I87009 Postthrombotic syndrome without complications of unspecified extremity: Secondary | ICD-10-CM

## 2023-08-15 NOTE — Patient Instructions (Addendum)
 [facility] [place], [place] [address]?[place], [place] [address] Phone: [phone] Fax: [phone] [email or web address]

## 2023-08-15 NOTE — Progress Notes (Signed)
 Subjective:   Tiffany [name] Mar 01, 1986 08/15/2023  Chief Complaint  Patient presents with   Medical Management of Chronic Issues    Former Foye Clock pt here for transfer of care. Has been having some problems with her leg that has disturbed her sleep. Wants to also get checked to see if she might have ADD.    HPI: Tiffany [name] presents today for re-assessment and management of chronic medical conditions.   BILATERAL LEG PAIN:  Patient reports chronic swelling and pain of bilateral lower legs. She reports her left leg is worse than her right.  She states approx. 2 weeks ago she was outdoors and had mild tan present to her left leg. She noticed swelling and noticed upon getting in the shower her leg began burning with contact to the water. She states this has not occurred since approx. 2 weeks ago. She states she has noticed mild to moderate shortness of breath with exertion. She states she can exercise for approx. 10 minutes and then notices her leg begins to hurt significantly with burning sensation and shortness of breath. She states she has noticed increased bruising to bilateral lower extremities. Patient has not been using leg compression stockings due to needing a new pair from overstretching. Patient was previously followed by Hem/Onc and not recommend chronic anticoagulation as her Lupus anticoagulant level was negative in Aug 2024 per Dr. Myrle Sheng.   Patient's most recent US Venous Study was 12/05/2022:  IMPRESSION: No evidence of deep venous thrombosis seen in the right lower extremity.   Nonocclusive thrombus is noted in the left common femoral, femoral, popliteal and possibly posterior tibial veins most consistent with chronic recanalized deep venous thrombosis   ADHD Concern:  Patient is concerned regarding possible ADD/ADHD and would like evaluation for possible diagnosis and medication management. Patient reports difficulty concentrating with tasks, staying  focused and completion with work impairment. She reports trying to drink larger amounts of caffeine without improvement.    The following portions of the patient's history were reviewed and updated as appropriate: past medical history, past surgical history, family history, social history, allergies, medications, and problem list.   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   Asthma    Deep vein thrombosis (DVT) (HCC)    left leg   Hernia, umbilical    plans for repair soon   History of sleep walking 07/20/2016   Lupus anticoagulant disorder (HCC) 03/2006   Detected   Memory problem 07/20/2016   Missed abortion 05/2008   SP D&C   Personal history of noncompliance with medical treatment, presenting hazards to health 07/20/2016   Preterm labor    Previous cesarean section 11/20/2010   PRIMARY HYPERCOAGULABLE STATE 02/18/2009   Annotation: Lupus anti-coagulant detected 03/24/06  Qualifier: Diagnosis of   By: Comer Locket  MD, Vijay         SAB (spontaneous abortion) 05/2007   Past Surgical History:  Procedure Laterality Date   CESAREAN SECTION  2004,2007   CESAREAN SECTION  11/20/2010   Procedure: CESAREAN SECTION;  Surgeon: Brock Bad, MD;  Location: [facility];  Service: Gynecology;  Laterality: N/A;  repeat cesarean  section    CESAREAN SECTION  11/15/2011   Procedure: CESAREAN SECTION;  Surgeon: Kathreen Cosier, MD;  Location: [facility];  Service: Gynecology;  Laterality: N/A;   DILATION AND CURETTAGE OF UTERUS  1/10   For missed AB   HYSTEROSCOPY WITH D & C N/A 10/15/2013   Procedure: HYSTEROSCOPY/DILATATION AND CURETTAGE;  Surgeon: Kathreen Cosier, MD;  Location: [facility];  Service: Gynecology;  Laterality: N/A;   INSERTION OF MESH N/A 03/01/2020   Procedure: INSERTION OF MESH;  Surgeon: Abigail Miyamoto, MD;  Location: [facility];  Service: General;  Laterality: N/A;   TONSILLECTOMY     UMBILICAL HERNIA REPAIR N/A 03/01/2020   Procedure: HERNIA REPAIR UMBILICAL WITH MESH;  Surgeon: Abigail Miyamoto, MD;  Location: [facility];  Service: General;  Laterality: N/A;   Family History  Problem Relation Age of Onset   Hyperlipidemia Mother    Hypertension Mother    Arthritis Mother    Hypertension Father    Hyperlipidemia Father    Diabetes Father    Stroke Father    Cataracts Father    Other Neg Hx    Outpatient Medications Prior to Visit  Medication Sig Dispense Refill   escitalopram (LEXAPRO) 20 MG tablet TAKE 1 TABLET BY MOUTH EVERY DAY 90 tablet 1   No facility-administered medications prior to visit.   Allergies  Allergen Reactions   Onion Other (See Comments)    Blisters in mouth     ROS: A complete ROS was performed with pertinent positives/negatives noted in the HPI. The remainder of the ROS are negative.    Objective:   Today's Vitals   08/15/23 1302 08/15/23 1335  BP: (!) 126/96 138/85  Pulse: 87   SpO2: 100%   Weight: 165 lb (74.8 kg)   Height: 5\' 6"  (1.676 m)     Physical Exam          GENERAL: Well-appearing, in NAD. Well nourished.  SKIN: Pink, warm and dry. No rash, lesion, ulceration, or ecchymoses.  Head: Normocephalic. NECK: Trachea midline. Full ROM w/o pain or tenderness.  RESPIRATORY: Chest wall symmetrical. Respirations  even and non-labored. Breath sounds clear to auscultation bilaterally.  CARDIAC: S1, S2 present, regular rate and rhythm without murmur or gallops. Peripheral pulses 2+ bilaterally.  MSK: Muscle tone and strength appropriate for age. Joints w/o tenderness, redness, or swelling.  EXTREMITIES: Without clubbing, cyanosis, or edema. +3 pedal pulses bilateral. Mild swelling to bilateral lower extremities.  NEUROLOGIC: No motor or sensory deficits. Steady, even gait. C2-C12 intact.  PSYCH/MENTAL STATUS: Alert, oriented x 3. Cooperative, appropriate mood and affect.      Assessment & Plan:  1. Postphlebitic syndrome (Primary) 2. Chronic deep vein thrombosis (DVT) of other vein of lower extremity, unspecified laterality (HCC) Possible worsening of chronic DVT versus postphlebitic syndrome. Will recommend patient restart her compression stockings and will obtain STAT US venous study bilaterally given hx. Will notify pt of results when available.  - US Venous Img Lower Bilateral; Future  3. Bilateral leg pain Will obtain ultrasound, cbc, iron and d-dimer given hx of DVT. Pt to start compression stocking and  will be notified of results.  - US  Venous Img Lower Bilateral; Future - CBC with Differential/Platelet - Iron, TIBC and Ferritin Panel - D-Dimer, Quantitative  4. Difficulty concentrating Information for [facility] provided to patient to obtain dx. Will be happy to continue medication management after diagnosis.    No orders of the defined types were placed in this encounter.  Lab Orders         CBC with Differential/Platelet         Iron, TIBC and Ferritin Panel         D-Dimer, Quantitative      Return in about 4 months (around 12/15/2023) for ANNUAL PHYSICAL.    Patient to reach out to office if new, worrisome, or unresolved symptoms arise or if no improvement in patient's condition. Patient verbalized understanding and is agreeable to treatment plan. All questions  answered to patient's satisfaction.    Nonda Bays, [place]

## 2023-08-16 LAB — CBC WITH DIFFERENTIAL/PLATELET
Basophils Absolute: 0.1 10*3/uL (ref 0.0–0.2)
Basos: 1 %
EOS (ABSOLUTE): 0.1 10*3/uL (ref 0.0–0.4)
Eos: 1 %
Hematocrit: 40.3 % (ref 34.0–46.6)
Hemoglobin: 13.5 g/dL (ref 11.1–15.9)
Immature Grans (Abs): 0 10*3/uL (ref 0.0–0.1)
Immature Granulocytes: 0 %
Lymphocytes Absolute: 4.3 10*3/uL — ABNORMAL HIGH (ref 0.7–3.1)
Lymphs: 44 %
MCH: 29.1 pg (ref 26.6–33.0)
MCHC: 33.5 g/dL (ref 31.5–35.7)
MCV: 87 fL (ref 79–97)
Monocytes Absolute: 0.9 10*3/uL (ref 0.1–0.9)
Monocytes: 9 %
Neutrophils Absolute: 4.4 10*3/uL (ref 1.4–7.0)
Neutrophils: 45 %
Platelets: 410 10*3/uL (ref 150–450)
RBC: 4.64 x10E6/uL (ref 3.77–5.28)
RDW: 12.4 % (ref 11.7–15.4)
WBC: 9.8 10*3/uL (ref 3.4–10.8)

## 2023-08-16 LAB — IRON,TIBC AND FERRITIN PANEL
Ferritin: 69 ng/mL (ref 15–150)
Iron Saturation: 23 % (ref 15–55)
Iron: 87 ug/dL (ref 27–159)
Total Iron Binding Capacity: 385 ug/dL (ref 250–450)
UIBC: 298 ug/dL (ref 131–425)

## 2023-08-16 LAB — D-DIMER, QUANTITATIVE: D-DIMER: <0.2 mg{FEU}/L (ref 0.00–0.49)

## 2023-08-17 ENCOUNTER — Ambulatory Visit
Admission: RE | Admit: 2023-08-17 | Discharge: 2023-08-17 | Disposition: A | Discharge status: Home / Self Care | Source: Ambulatory Visit | Attending: Family Medicine | Admitting: Family Medicine

## 2023-08-17 DIAGNOSIS — I87009 Postthrombotic syndrome without complications of unspecified extremity: Secondary | ICD-10-CM

## 2023-08-17 DIAGNOSIS — I82599 Chronic embolism and thrombosis of other specified deep vein of unspecified lower extremity: Secondary | ICD-10-CM

## 2023-08-17 DIAGNOSIS — M79604 Pain in right leg: Secondary | ICD-10-CM

## 2023-08-20 ENCOUNTER — Encounter (HOSPITAL_BASED_OUTPATIENT_CLINIC_OR_DEPARTMENT_OTHER): Payer: Self-pay | Admitting: Family Medicine

## 2023-08-20 NOTE — Progress Notes (Signed)
 Hi Chelsy,  Your ultrasound did not show any changes from your previous ultrasound in the past year.  There is still a chronic DVT noted in the left femoral and popliteal veins of your left lower leg.  Vascular services may be a benefit given the chronic DVT presence and if you would like referral to them, would be happy to provide this for you.

## 2023-08-20 NOTE — Progress Notes (Signed)
 Your hemoglobin, hematocrit and red-white blood cell count were normal.  Your lymphocyte count has been chronically elevated for several years and was followed by oncology.  Dr. Bartholomew Light recommended returning to his office if the lymphocyte count was persistently elevated.  At this time, I would recommend an evaluation by him and/or monitoring of this count.  If you are agreeable to a referral to his office, please let me know.

## 2023-08-23 ENCOUNTER — Other Ambulatory Visit (HOSPITAL_BASED_OUTPATIENT_CLINIC_OR_DEPARTMENT_OTHER): Payer: Self-pay | Admitting: Family Medicine

## 2023-08-23 DIAGNOSIS — D72820 Lymphocytosis (symptomatic): Secondary | ICD-10-CM

## 2023-08-24 ENCOUNTER — Other Ambulatory Visit: Payer: Self-pay | Admitting: *Deleted

## 2023-08-24 DIAGNOSIS — D6862 Lupus anticoagulant syndrome: Secondary | ICD-10-CM

## 2023-08-31 ENCOUNTER — Inpatient Hospital Stay: Attending: Oncology

## 2023-08-31 ENCOUNTER — Inpatient Hospital Stay: Admitting: Oncology

## 2023-08-31 ENCOUNTER — Encounter: Payer: Self-pay | Admitting: Oncology

## 2023-08-31 VITALS — BP 120/93 | HR 96 | Temp 98.2°F | Resp 18 | Ht 66.0 in | Wt 165.8 lb

## 2023-08-31 DIAGNOSIS — I82409 Acute embolism and thrombosis of unspecified deep veins of unspecified lower extremity: Secondary | ICD-10-CM | POA: Diagnosis not present

## 2023-08-31 DIAGNOSIS — Z86718 Personal history of other venous thrombosis and embolism: Secondary | ICD-10-CM | POA: Diagnosis not present

## 2023-08-31 DIAGNOSIS — Z3A00 Weeks of gestation of pregnancy not specified: Secondary | ICD-10-CM

## 2023-08-31 DIAGNOSIS — D6862 Lupus anticoagulant syndrome: Secondary | ICD-10-CM

## 2023-08-31 DIAGNOSIS — O2230 Deep phlebothrombosis in pregnancy, unspecified trimester: Secondary | ICD-10-CM

## 2023-08-31 DIAGNOSIS — D72820 Lymphocytosis (symptomatic): Secondary | ICD-10-CM | POA: Insufficient documentation

## 2023-08-31 LAB — CBC WITH DIFFERENTIAL (CANCER CENTER ONLY)
Abs Immature Granulocytes: 0.02 10*3/uL (ref 0.00–0.07)
Basophils Absolute: 0.1 10*3/uL (ref 0.0–0.1)
Basophils Relative: 1 %
Eosinophils Absolute: 0.2 10*3/uL (ref 0.0–0.5)
Eosinophils Relative: 1 %
HCT: 36.7 % (ref 36.0–46.0)
Hemoglobin: 12.3 g/dL (ref 12.0–15.0)
Immature Granulocytes: 0 %
Lymphocytes Relative: 41 %
Lymphs Abs: 4.4 10*3/uL — ABNORMAL HIGH (ref 0.7–4.0)
MCH: 29.6 pg (ref 26.0–34.0)
MCHC: 33.5 g/dL (ref 30.0–36.0)
MCV: 88.2 fL (ref 80.0–100.0)
Monocytes Absolute: 0.9 10*3/uL (ref 0.1–1.0)
Monocytes Relative: 8 %
Neutro Abs: 5.3 10*3/uL (ref 1.7–7.7)
Neutrophils Relative %: 49 %
Platelet Count: 411 10*3/uL — ABNORMAL HIGH (ref 150–400)
RBC: 4.16 MIL/uL (ref 3.87–5.11)
RDW: 12.8 % (ref 11.5–15.5)
WBC Count: 10.8 10*3/uL — ABNORMAL HIGH (ref 4.0–10.5)
nRBC: 0 % (ref 0.0–0.2)

## 2023-08-31 LAB — SAVE SMEAR(SSMR), FOR PROVIDER SLIDE REVIEW

## 2023-08-31 NOTE — Progress Notes (Signed)
 Tiffany [name] OFFICE PROGRESS NOTE   Diagnosis: History of DVT, mild lymphocytosis  INTERVAL HISTORY:   Tiffany [name] was seen in the hematology clinic in August 2024 with a history of recurrent venous thrombosis surrounding pregnancy.  A lupus anticoagulant panel was negative. She had labs via her primary provider 08/15/2023.  A D-dimer returned at less than 0.2.  A CBC found the white count at 9.8, hemoglobin 13.5, platelets 410,000, WBC 9.8, and ANC 4.4.  The absolute lymphocyte count returned at 4.3.  She is referred to evaluate the lymphocytosis.  Tiffany [name] reports chronic swelling in the left leg, worse after exercise.  No symptom of recurrent thrombosis.  She has poor exercise tolerance.  This has been present for several years.  No cough or chest pain.  No recent infection.  No fever.  She does not smoke. She reports recent "burning "at the C-section scar.  She has abdominal tenderness since undergoing umbilical hernia repair surgery in 2021.  Objective:  Vital signs in last 24 hours:  Blood pressure (!) 120/93, pulse 96, temperature 98.2 F (36.8 C), temperature source Temporal, resp. rate 18, height 5\' 6"  (1.676 m), weight 165 lb 12.8 oz (75.2 kg), last menstrual period 07/18/2023, SpO2 99%.    Lymphatics: No cervical, supraclavicular, axillary, or inguinal nodes Resp: Lungs clear bilaterally Cardio: Regular rate and rhythm GI: No hepatosplenomegaly, no mass, the low transverse incision appears unremarkable, diffuse tenderness Vascular: Trace edema at the left lower leg, no erythema or palpable cord, no tenderness    Lab Results:  Lab Results  Component Value Date   WBC 10.8 (H) 08/31/2023   HGB 12.3 08/31/2023   HCT 36.7 08/31/2023   MCV 88.2 08/31/2023   PLT 411 (H) 08/31/2023   NEUTROABS PENDING 08/31/2023   Blood smear: The platelets appear normal in number.  No platelet clumps.  Few ovalocytes, acanthocytes, and burr cells.  The polychromasia is  not increased.  No nucleated red cells.  The majority the white cells are mature appearing neutrophils and lymphocytes.  Few smudge cells.  No blasts or other young forms are seen.  I saw 1 possible binucleate lymphocyte.   CMP  Lab Results  Component Value Date   NA 138 12/25/2022   K 3.9 12/25/2022   CL 105 12/25/2022   CO2 26 12/25/2022   GLUCOSE 99 12/25/2022   BUN 9 12/25/2022   CREATININE 0.90 12/25/2022   CALCIUM 9.0 12/25/2022   PROT 7.1 12/25/2022   ALBUMIN 4.4 12/25/2022   AST 20 12/25/2022   ALT 13 12/25/2022   ALKPHOS 34 (L) 12/25/2022   BILITOT 0.5 12/25/2022   GFRNONAA >60 12/25/2022   GFRAA 98 03/15/2020     Medications: I have reviewed the patient's current medications.   Assessment/Plan: Left lower extremity DVT  DVT in 2007 following pregnancy, treated with approximately 3 years of Coumadin 2 subsequent pregnancies with Lovenox  prophylaxis Multiple miscarriages Left lower extremity swelling following pregnancy in 2013 treated with Lovenox  and Xarelto , unclear whether she was documented to have an acute DVT at the time Xarelto  anticoagulation continued until 2019 Report of a "lupus anticoagulant disorder "in 2007-no documentation available in the medical record Negative hypercoagulation panel including lupus anticoagulant panel 02/26/2013 Multiple lower extremity Doppler evaluations, most recently 12/05/2022 with chronic left lower extremity DVT Negative lupus anticoagulant panel Umbilical hernia repair November 2021 Chronic mild lymphocytosis    Disposition: Tiffany [name] has a history of venous thrombosis surrounding pregnancy.  She has left leg postphlebitic  syndrome.  She will alert treating physicians of her history if she has another pregnancy, surgery, or prolonged immobility.  I recommended she obtain a support stocking for the chronic left leg swelling.  She has chronic mild lymphocytosis.  The lymphocyte count has not changed significantly for  several years.  This is likely a benign nonspecific finding.  I have a low clinical suspicion for a lymphoproliferative disorder.  The lymphocyte count is pending today. The platelet count is slightly above the upper end of the normal range today, but has not changed significantly for years.  This is also likely a benign normal variant. The peripheral blood smear is generally unremarkable, but there may be changes of early iron deficiency.  I will recommend a ferritin level at her next lab visit.  Tiffany [name] will follow-up with [facility] for internal medicine care.  I recommend a CBC at the time of her yearly physical.  I will be glad to see her again if she develops progressive lymphocytosis, thrombocytosis, or a new hematologic abnormality.  She should follow-up with her primary provider or surgeons for evaluation of the abdominal scar burning and abdominal tenderness. Coni Deep, MD  08/31/2023  9:18 AM

## 2023-10-15 ENCOUNTER — Encounter (HOSPITAL_BASED_OUTPATIENT_CLINIC_OR_DEPARTMENT_OTHER): Payer: Self-pay | Admitting: Family Medicine

## 2023-10-16 ENCOUNTER — Other Ambulatory Visit (HOSPITAL_BASED_OUTPATIENT_CLINIC_OR_DEPARTMENT_OTHER): Payer: Self-pay | Admitting: *Deleted

## 2023-10-16 DIAGNOSIS — F330 Major depressive disorder, recurrent, mild: Secondary | ICD-10-CM

## 2023-10-16 MED ORDER — ESCITALOPRAM OXALATE 20 MG PO TABS: 20.0000 mg | ORAL_TABLET | Freq: Every day | ORAL | 1 refills | Status: AC

## 2023-10-30 ENCOUNTER — Encounter (HOSPITAL_BASED_OUTPATIENT_CLINIC_OR_DEPARTMENT_OTHER): Payer: Self-pay | Admitting: Family Medicine
